# Patient Record
Sex: Male | Born: 1955 | Race: White | Hispanic: No | Marital: Married | Smoking: Never smoker
Health system: Southern US, Community
[De-identification: ages and names within clinical notes are randomized; demographics above are authoritative.]

## PROBLEM LIST (undated history)

## (undated) DIAGNOSIS — E785 Hyperlipidemia, unspecified: Secondary | ICD-10-CM

## (undated) DIAGNOSIS — I10 Essential (primary) hypertension: Secondary | ICD-10-CM

## (undated) DIAGNOSIS — E119 Type 2 diabetes mellitus without complications: Secondary | ICD-10-CM

## (undated) HISTORY — DX: Essential (primary) hypertension: I10

## (undated) HISTORY — DX: Hyperlipidemia, unspecified: E78.5

## (undated) HISTORY — DX: Type 2 diabetes mellitus without complications: E11.9

---

## 2006-04-06 ENCOUNTER — Ambulatory Visit: Payer: Self-pay | Admitting: Internal Medicine

## 2006-05-10 ENCOUNTER — Ambulatory Visit: Payer: Self-pay | Admitting: Internal Medicine

## 2013-05-28 ENCOUNTER — Encounter: Payer: Self-pay | Admitting: Internal Medicine

## 2016-05-25 ENCOUNTER — Encounter: Payer: Self-pay | Admitting: Internal Medicine

## 2021-09-23 ENCOUNTER — Ambulatory Visit: Payer: HMO | Admitting: Cardiology

## 2021-09-23 ENCOUNTER — Encounter: Payer: Self-pay | Admitting: Cardiology

## 2021-09-23 ENCOUNTER — Other Ambulatory Visit: Payer: Self-pay

## 2021-09-23 VITALS — BP 134/81 | HR 77 | Temp 98.0°F | Resp 16 | Ht 68.0 in | Wt 267.0 lb

## 2021-09-23 DIAGNOSIS — R0609 Other forms of dyspnea: Secondary | ICD-10-CM | POA: Insufficient documentation

## 2021-09-23 DIAGNOSIS — Z8249 Family history of ischemic heart disease and other diseases of the circulatory system: Secondary | ICD-10-CM

## 2021-09-23 DIAGNOSIS — I10 Essential (primary) hypertension: Secondary | ICD-10-CM | POA: Insufficient documentation

## 2021-09-23 DIAGNOSIS — R9439 Abnormal result of other cardiovascular function study: Secondary | ICD-10-CM

## 2021-09-23 DIAGNOSIS — E782 Mixed hyperlipidemia: Secondary | ICD-10-CM

## 2021-09-23 DIAGNOSIS — R072 Precordial pain: Secondary | ICD-10-CM

## 2021-09-23 DIAGNOSIS — E119 Type 2 diabetes mellitus without complications: Secondary | ICD-10-CM

## 2021-09-23 MED ORDER — PRAVASTATIN SODIUM 20 MG PO TABS: 20.0000 mg | ORAL_TABLET | Freq: Every evening | ORAL | 3 refills | Status: DC

## 2021-09-23 NOTE — Addendum Note (Signed)
Addended by: Elder Negus on: 09/23/2021 01:46 PM   Modules accepted: Orders

## 2021-09-23 NOTE — Progress Notes (Signed)
Patient self referred for dyspnea on exertion, chest pain, abnormal stress test  Subjective:   Gary Jimenez, male    DOB: 12-Jul-1956, 65 y.o.   MRN: [id number]   Chief Complaint  Patient presents with   Hypertension   New Patient (Initial Visit)   abnormal stress test     HPI  65 year old Caucasian male with hypertension, hyperlipidemia, type 2 diabetes mellitus, family history of premature CAD, self referred for evaluation of chest pain, dyspnea, abnormal stress test.  Patient is here today with his wife.  Patient worked at [employer] for several years, retired in 2019.  He is to be quite active doing yard work over several acres without much difficulty, until summer 2022.  Over the last 6 months, he has had progressive worsening of dyspnea on exertion.  Now, he struggles to do minimal yard work.  He has only occasional episodes of chest pain on exertion, but this is not a common feature.  He does have controlled hypertension and hyperlipidemia.  He has had stable weight around 260 pounds until about a month ago, and he is gained 6 pounds.  He has OSA, regularly uses CPAP.  He has controlled hypertension and type 2 diabetes mellitus.  Lipids are reasonably well controlled.  He has not tolerated statins-Crestor Lipitor, in the past due to myalgias.  He currently takes Zetia.  Patient was last seen by cardiologist Dr. Abran Richard at [facility] on 09/01/2021.  Patient underwent work-up for chest pain and dyspnea at their office.  Echocardiogram showed normal EF, no significant valvular abnormality.  Stress test showed moderate size inferior wall, partially reversible defect.  Given the patient's dyspnea had improved and he has not had any significant recurrent chest discomfort, option of medical management versus invasive work-up was offered to the patient.  However, patient wanted to seek second opinion with our practice since his wife is also established patient  of ours.   Past Medical History:  Diagnosis Date   Diabetes mellitus without complication ([place])    Hyperlipidemia    Hypertension      History reviewed. No pertinent surgical history.   Social History   Tobacco Use  Smoking Status Never  Smokeless Tobacco Never    Social History   Substance and Sexual Activity  Alcohol Use Not Currently     Family History  Problem Relation Age of Onset   Hyperlipidemia Mother    Hypertension Mother    Heart disease Mother    Heart disease Father      Current Outpatient Medications on File Prior to Visit  Medication Sig Dispense Refill   ezetimibe (ZETIA) 10 MG tablet Take 1 tablet by mouth daily.     olmesartan (BENICAR) 40 MG tablet Take 1 tablet by mouth daily.     amLODipine (NORVASC) 10 MG tablet Take 10 mg by mouth daily.     B Complex Vitamins (VITAMIN B COMPLEX) TABS Take 1 tablet by mouth daily.     glimepiride (AMARYL) 2 MG tablet Take 2 mg by mouth daily.     hydrochlorothiazide (HYDRODIURIL) 25 MG tablet Take 25 mg by mouth daily.     metFORMIN (GLUCOPHAGE-XR) 500 MG 24 hr tablet Take 1,000 mg by mouth 2 (two) times daily.     omeprazole (PRILOSEC) 40 MG capsule Take 40 mg by mouth daily.     No current facility-administered medications on file prior to visit.    Cardiovascular and other pertinent studies:  EKG 09/23/2021: Sinus rhythm 90 bpm Normal EKG  Echocardiogram 07/27/2021: Left ventricular systolic function is normal.  LV ejection fraction = 55-60%.  There is aortic valve sclerosis.  There is no aortic stenosis.  There is no comparison study available.    Recent labs: 06/01/2021: Glucose 140.  BUN/creatinine 36/1.56.  eGFR 49.  NA/K 138/4.7.  ALT 53.  Rest of the CMP normal. H/H 14/42.  MCV 89.  Platelets 245. HbA1c 7.0% TSH 0.84 normal  12/2020: Chol 167, TG 192, HDL 43,  LDL 104   Review of Systems  Cardiovascular:  Positive for chest pain and dyspnea on exertion. Negative for leg  swelling, palpitations and syncope.  Musculoskeletal:  Positive for joint pain.        Vitals:   09/23/21 0835  BP: 134/81  Pulse: 77  Resp: 16  Temp: 98 F (36.7 C)  SpO2: 96%     Body mass index is 40.6 kg/m. Filed Weights   09/23/21 0835  Weight: 267 lb (121.1 kg)     Objective:   Physical Exam Vitals and nursing note reviewed.  Constitutional:      General: He is not in acute distress.    Appearance: He is obese.  Neck:     Vascular: No JVD.  Cardiovascular:     Rate and Rhythm: Normal rate and regular rhythm.     Pulses: Normal pulses.     Heart sounds: Normal heart sounds. No murmur heard. Pulmonary:     Effort: Pulmonary effort is normal.     Breath sounds: Normal breath sounds. No wheezing or rales.  Musculoskeletal:     Right lower leg: No edema.     Left lower leg: No edema.        Assessment & Recommendations:    65 year old Caucasian male with hypertension, hyperlipidemia, type 2 diabetes mellitus, OSA on CPAP, family history of premature CAD, self referred for evaluation of chest pain, dyspnea, abnormal stress test.  Dyspnea on exertion, chest pain: Chest pain is occasional.  Dyspnea on exertion is persistent and worsening for past 6 months. Differential diagnoses include obesity, deconditioning, potential pulmonary pathology (prior exposure to particulate matter at work), or obstructive coronary artery disease with predominantly angina equivalent symptoms. Is currently at least 1 antianginal agent and amlodipine 10 mg daily, with no improvement in symptoms. Reported abnormal stress test performed at [facility], with moderate inferior ischemia. While pulmonary pathology is most likely etiology, it is reasonable to proceed with left and right heart catheterization to definitively exclude obstructive coronary artery disease, as well as pulmonary hypertension. Risks, benefits discussed with the patient in detail.  Patient would like  to proceed.  Mixed hyperlipidemia: Diabetic patient, currently on Zetia.  Intolerant to Crestor or Lipitor due to myalgias. Will try pravastatin 20 mg daily.  Hypertension: Well-controlled  Type 2 diabetes mellitus: Controlled, continue follow-up with PCP  Further recommendations after above testing  Thank you for referring the patient to [place]. Please feel free to contact with any questions.   Nigel Mormon, MD Pager: [phone] Office: [phone]

## 2021-09-24 LAB — BASIC METABOLIC PANEL
BUN/Creatinine Ratio: 18 (ref 10–24)
BUN: 25 mg/dL (ref 8–27)
CO2: 21 mmol/L (ref 20–29)
Calcium: 10.3 mg/dL — ABNORMAL HIGH (ref 8.6–10.2)
Chloride: 101 mmol/L (ref 96–106)
Creatinine, Ser: 1.36 mg/dL — ABNORMAL HIGH (ref 0.76–1.27)
Glucose: 237 mg/dL — ABNORMAL HIGH (ref 70–99)
Potassium: 4.7 mmol/L (ref 3.5–5.2)
Sodium: 144 mmol/L (ref 134–144)
eGFR: 58 mL/min/{1.73_m2} — ABNORMAL LOW (ref >59)

## 2021-09-24 LAB — CBC
Hematocrit: 45.4 % (ref 37.5–51.0)
Hemoglobin: 14.9 g/dL (ref 13.0–17.7)
MCH: 29.5 pg (ref 26.6–33.0)
MCHC: 32.8 g/dL (ref 31.5–35.7)
MCV: 90 fL (ref 79–97)
Platelets: 246 10*3/uL (ref 150–450)
RBC: 5.05 x10E6/uL (ref 4.14–5.80)
RDW: 12.4 % (ref 11.6–15.4)
WBC: 7.2 10*3/uL (ref 3.4–10.8)

## 2021-10-05 ENCOUNTER — Ambulatory Visit (HOSPITAL_COMMUNITY)
Admission: RE | Admit: 2021-10-05 | Discharge: 2021-10-05 | Disposition: A | Discharge status: Home / Self Care | Payer: HMO | Attending: Cardiology | Admitting: Cardiology

## 2021-10-05 ENCOUNTER — Ambulatory Visit (HOSPITAL_COMMUNITY)
Admission: RE | Disposition: A | Discharge status: Home / Self Care | Payer: Self-pay | Source: Home / Self Care | Attending: Cardiology

## 2021-10-05 DIAGNOSIS — Z7984 Long term (current) use of oral hypoglycemic drugs: Secondary | ICD-10-CM | POA: Insufficient documentation

## 2021-10-05 DIAGNOSIS — R9439 Abnormal result of other cardiovascular function study: Secondary | ICD-10-CM

## 2021-10-05 DIAGNOSIS — Z79899 Other long term (current) drug therapy: Secondary | ICD-10-CM | POA: Diagnosis not present

## 2021-10-05 DIAGNOSIS — I2510 Atherosclerotic heart disease of native coronary artery without angina pectoris: Secondary | ICD-10-CM | POA: Insufficient documentation

## 2021-10-05 DIAGNOSIS — I10 Essential (primary) hypertension: Secondary | ICD-10-CM | POA: Diagnosis not present

## 2021-10-05 DIAGNOSIS — G4733 Obstructive sleep apnea (adult) (pediatric): Secondary | ICD-10-CM | POA: Diagnosis not present

## 2021-10-05 DIAGNOSIS — R0609 Other forms of dyspnea: Secondary | ICD-10-CM | POA: Insufficient documentation

## 2021-10-05 DIAGNOSIS — R072 Precordial pain: Secondary | ICD-10-CM | POA: Diagnosis present

## 2021-10-05 DIAGNOSIS — E782 Mixed hyperlipidemia: Secondary | ICD-10-CM | POA: Insufficient documentation

## 2021-10-05 DIAGNOSIS — Z8249 Family history of ischemic heart disease and other diseases of the circulatory system: Secondary | ICD-10-CM | POA: Diagnosis not present

## 2021-10-05 DIAGNOSIS — E119 Type 2 diabetes mellitus without complications: Secondary | ICD-10-CM | POA: Insufficient documentation

## 2021-10-05 DIAGNOSIS — Z794 Long term (current) use of insulin: Secondary | ICD-10-CM

## 2021-10-05 HISTORY — PX: RIGHT/LEFT HEART CATH AND CORONARY ANGIOGRAPHY: CATH118266

## 2021-10-05 LAB — POCT I-STAT 7, (LYTES, BLD GAS, ICA,H+H)
Acid-base deficit: 2 mmol/L (ref 0.0–2.0)
Bicarbonate: 22.3 mmol/L (ref 20.0–28.0)
Calcium, Ion: 1.19 mmol/L (ref 1.15–1.40)
HCT: 39 % (ref 39.0–52.0)
Hemoglobin: 13.3 g/dL (ref 13.0–17.0)
O2 Saturation: 99 %
Potassium: 3.8 mmol/L (ref 3.5–5.1)
Sodium: 138 mmol/L (ref 135–145)
TCO2: 23 mmol/L (ref 22–32)
pCO2 arterial: 35.8 mmHg (ref 32.0–48.0)
pH, Arterial: 7.403 (ref 7.350–7.450)
pO2, Arterial: 134 mmHg — ABNORMAL HIGH (ref 83.0–108.0)

## 2021-10-05 LAB — POCT I-STAT EG7
Acid-base deficit: 1 mmol/L (ref 0.0–2.0)
Bicarbonate: 24.1 mmol/L (ref 20.0–28.0)
Calcium, Ion: 1.24 mmol/L (ref 1.15–1.40)
HCT: 40 % (ref 39.0–52.0)
Hemoglobin: 13.6 g/dL (ref 13.0–17.0)
O2 Saturation: 75 %
Potassium: 4 mmol/L (ref 3.5–5.1)
Sodium: 137 mmol/L (ref 135–145)
TCO2: 25 mmol/L (ref 22–32)
pCO2, Ven: 41.9 mmHg — ABNORMAL LOW (ref 44.0–60.0)
pH, Ven: 7.368 (ref 7.250–7.430)
pO2, Ven: 41 mmHg (ref 32.0–45.0)

## 2021-10-05 LAB — GLUCOSE, CAPILLARY
Glucose-Capillary: 245 mg/dL — ABNORMAL HIGH (ref 70–99)
Glucose-Capillary: 190 mg/dL — ABNORMAL HIGH (ref 70–99)

## 2021-10-05 LAB — POCT ACTIVATED CLOTTING TIME
Activated Clotting Time: 215 seconds
Activated Clotting Time: 269 seconds

## 2021-10-05 SURGERY — RIGHT/LEFT HEART CATH AND CORONARY ANGIOGRAPHY
Anesthesia: LOCAL

## 2021-10-05 MED ORDER — LABETALOL HCL 5 MG/ML IV SOLN: 10.0000 mg | INTRAVENOUS | Status: DC | PRN

## 2021-10-05 MED ORDER — ASPIRIN 81 MG PO CHEW
81.0000 mg | CHEWABLE_TABLET | ORAL | Status: DC
  Filled 2021-10-05: qty 1

## 2021-10-05 MED ORDER — LIDOCAINE HCL (PF) 1 % IJ SOLN
INTRAMUSCULAR | Status: AC
  Filled 2021-10-05: qty 30

## 2021-10-05 MED ORDER — HEPARIN (PORCINE) IN NACL 1000-0.9 UT/500ML-% IV SOLN
INTRAVENOUS | Status: AC
  Filled 2021-10-05: qty 500

## 2021-10-05 MED ORDER — NITROGLYCERIN 1 MG/10 ML FOR IR/CATH LAB
INTRA_ARTERIAL | Status: AC
  Filled 2021-10-05: qty 10

## 2021-10-05 MED ORDER — VERAPAMIL HCL 2.5 MG/ML IV SOLN
INTRAVENOUS | Status: DC | PRN
  Administered 2021-10-05: 10 mL via INTRA_ARTERIAL

## 2021-10-05 MED ORDER — LIDOCAINE HCL (PF) 1 % IJ SOLN
INTRAMUSCULAR | Status: DC | PRN
  Administered 2021-10-05 (×2): 2 mL

## 2021-10-05 MED ORDER — HEPARIN (PORCINE) IN NACL 1000-0.9 UT/500ML-% IV SOLN
INTRAVENOUS | Status: DC | PRN
  Administered 2021-10-05 (×3): 500 mL

## 2021-10-05 MED ORDER — VERAPAMIL HCL 2.5 MG/ML IV SOLN
INTRAVENOUS | Status: AC
  Filled 2021-10-05: qty 2

## 2021-10-05 MED ORDER — IOHEXOL 350 MG/ML SOLN
INTRAVENOUS | Status: DC | PRN
  Administered 2021-10-05: 120 mL

## 2021-10-05 MED ORDER — FENTANYL CITRATE (PF) 100 MCG/2ML IJ SOLN
INTRAMUSCULAR | Status: AC
  Filled 2021-10-05: qty 2

## 2021-10-05 MED ORDER — SODIUM CHLORIDE 0.9 % WEIGHT BASED INFUSION
3.0000 mL/kg/h | INTRAVENOUS | Status: AC
  Administered 2021-10-05: 3 mL/kg/h via INTRAVENOUS

## 2021-10-05 MED ORDER — HYDRALAZINE HCL 20 MG/ML IJ SOLN: 10.0000 mg | INTRAMUSCULAR | Status: DC | PRN

## 2021-10-05 MED ORDER — SODIUM CHLORIDE 0.9% FLUSH: 3.0000 mL | Freq: Two times a day (BID) | INTRAVENOUS | Status: DC

## 2021-10-05 MED ORDER — FENTANYL CITRATE (PF) 100 MCG/2ML IJ SOLN
INTRAMUSCULAR | Status: DC | PRN
  Administered 2021-10-05: 25 ug via INTRAVENOUS
  Administered 2021-10-05: 50 ug via INTRAVENOUS

## 2021-10-05 MED ORDER — SODIUM CHLORIDE 0.9 % IV SOLN: 250.0000 mL | INTRAVENOUS | Status: DC | PRN

## 2021-10-05 MED ORDER — HEPARIN (PORCINE) IN NACL 1000-0.9 UT/500ML-% IV SOLN
INTRAVENOUS | Status: AC
  Filled 2021-10-05: qty 1000

## 2021-10-05 MED ORDER — MIDAZOLAM HCL 2 MG/2ML IJ SOLN
INTRAMUSCULAR | Status: AC
  Filled 2021-10-05: qty 2

## 2021-10-05 MED ORDER — METOPROLOL SUCCINATE ER 25 MG PO TB24: 25.0000 mg | ORAL_TABLET | Freq: Every day | ORAL | 1 refills | Status: DC

## 2021-10-05 MED ORDER — HEPARIN SODIUM (PORCINE) 1000 UNIT/ML IJ SOLN
INTRAMUSCULAR | Status: AC
  Filled 2021-10-05: qty 10

## 2021-10-05 MED ORDER — SODIUM CHLORIDE 0.9 % WEIGHT BASED INFUSION: 1.0000 mL/kg/h | INTRAVENOUS | Status: DC

## 2021-10-05 MED ORDER — SODIUM CHLORIDE 0.9 % IV SOLN: INTRAVENOUS | Status: DC

## 2021-10-05 MED ORDER — ONDANSETRON HCL 4 MG/2ML IJ SOLN: 4.0000 mg | Freq: Four times a day (QID) | INTRAMUSCULAR | Status: DC | PRN

## 2021-10-05 MED ORDER — ACETAMINOPHEN 325 MG PO TABS: 650.0000 mg | ORAL_TABLET | ORAL | Status: DC | PRN

## 2021-10-05 MED ORDER — ASPIRIN 81 MG PO CHEW
81.0000 mg | CHEWABLE_TABLET | ORAL | Status: AC
  Administered 2021-10-05: 81 mg via ORAL

## 2021-10-05 MED ORDER — ASPIRIN EC 81 MG PO TBEC: 81.0000 mg | DELAYED_RELEASE_TABLET | Freq: Every day | ORAL | 3 refills | Status: DC

## 2021-10-05 MED ORDER — SODIUM CHLORIDE 0.9% FLUSH: 3.0000 mL | INTRAVENOUS | Status: DC | PRN

## 2021-10-05 MED ORDER — MIDAZOLAM HCL 2 MG/2ML IJ SOLN
INTRAMUSCULAR | Status: DC | PRN
  Administered 2021-10-05 (×2): 1 mg via INTRAVENOUS

## 2021-10-05 MED ORDER — HEPARIN SODIUM (PORCINE) 1000 UNIT/ML IJ SOLN
INTRAMUSCULAR | Status: DC | PRN
  Administered 2021-10-05: 3000 [IU] via INTRAVENOUS
  Administered 2021-10-05 (×2): 6000 [IU] via INTRAVENOUS

## 2021-10-05 SURGICAL SUPPLY — 21 items
CATH 5FR JL3.5 JR4 ANG PIG MP (CATHETERS) ×1 IMPLANT
CATH BALLN WEDGE 5F 110CM (CATHETERS) ×1 IMPLANT
CATH LAUNCHER 6FR EBU 3 (CATHETERS) ×1 IMPLANT
CATH SUPERCROSS ANGLED 90 DEG (MICROCATHETER) ×1 IMPLANT
CATH VISTA GUIDE 6FR XBLAD3.0 (CATHETERS) ×1 IMPLANT
DEVICE RAD COMP TR BAND LRG (VASCULAR PRODUCTS) ×1 IMPLANT
GLIDESHEATH SLEND A-KIT 6F 22G (SHEATH) ×1 IMPLANT
GUIDEWIRE .025 260CM (WIRE) ×1 IMPLANT
GUIDEWIRE INQWIRE 1.5J.035X260 (WIRE) IMPLANT
GUIDEWIRE PRESSURE X 175 (WIRE) ×1 IMPLANT
INQWIRE 1.5J .035X260CM (WIRE) ×2
KIT ESSENTIALS PG (KITS) ×1 IMPLANT
KIT HEART LEFT (KITS) ×2 IMPLANT
KIT HEMO VALVE WATCHDOG (MISCELLANEOUS) ×1 IMPLANT
PACK CARDIAC CATHETERIZATION (CUSTOM PROCEDURE TRAY) ×2 IMPLANT
SHEATH GLIDE SLENDER 4/5FR (SHEATH) ×1 IMPLANT
TRANSDUCER W/STOPCOCK (MISCELLANEOUS) ×2 IMPLANT
TUBING CIL FLEX 10 FLL-RA (TUBING) ×2 IMPLANT
WIRE ASAHI PROWATER 180CM (WIRE) ×1 IMPLANT
WIRE ASAHI PROWATER 300CM (WIRE) ×1 IMPLANT
WIRE HI TORQ WHISPER MS 190CM (WIRE) ×1 IMPLANT

## 2021-10-05 NOTE — H&P (Signed)
OV 12/22 copied for documentation     Patient self referred for dyspnea on exertion, chest pain, abnormal stress test  Subjective:   Gary Jimenez, male    DOB: 1956-01-08, 66 y.o.   MRN: [id number]   Chief Complaint  Patient presents with   Hypertension   New Patient (Initial Visit)   abnormal stress test     HPI  66 year old Caucasian male with hypertension, hyperlipidemia, type 2 diabetes mellitus, family history of premature CAD, self referred for evaluation of chest pain, dyspnea, abnormal stress test.  Patient is here today with his wife.  Patient worked at [employer] for several years, retired in 2019.  He is to be quite active doing yard work over several acres without much difficulty, until summer 2022.  Over the last 6 months, he has had progressive worsening of dyspnea on exertion.  Now, he struggles to do minimal yard work.  He has only occasional episodes of chest pain on exertion, but this is not a common feature.  He does have controlled hypertension and hyperlipidemia.  He has had stable weight around 260 pounds until about a month ago, and he is gained 6 pounds.  He has OSA, regularly uses CPAP.  He has controlled hypertension and type 2 diabetes mellitus.  Lipids are reasonably well controlled.  He has not tolerated statins-Crestor Lipitor, in the past due to myalgias.  He currently takes Zetia.  Patient was last seen by cardiologist Dr. Abran Richard at [facility] on 09/01/2021.  Patient underwent work-up for chest pain and dyspnea at their office.  Echocardiogram showed normal EF, no significant valvular abnormality.  Stress test showed moderate size inferior wall, partially reversible defect.  Given the patient's dyspnea had improved and he has not had any significant recurrent chest discomfort, option of medical management versus invasive work-up was offered to the patient.  However, patient wanted to seek second opinion with our practice since  his wife is also established patient of ours.   Past Medical History:  Diagnosis Date   Diabetes mellitus without complication ([place])    Hyperlipidemia    Hypertension      History reviewed. No pertinent surgical history.   Social History   Tobacco Use  Smoking Status Never  Smokeless Tobacco Never    Social History   Substance and Sexual Activity  Alcohol Use Not Currently     Family History  Problem Relation Age of Onset   Hyperlipidemia Mother    Hypertension Mother    Heart disease Mother    Heart disease Father      Current Outpatient Medications on File Prior to Visit  Medication Sig Dispense Refill   ezetimibe (ZETIA) 10 MG tablet Take 1 tablet by mouth daily.     olmesartan (BENICAR) 40 MG tablet Take 1 tablet by mouth daily.     amLODipine (NORVASC) 10 MG tablet Take 10 mg by mouth daily.     B Complex Vitamins (VITAMIN B COMPLEX) TABS Take 1 tablet by mouth daily.     glimepiride (AMARYL) 2 MG tablet Take 2 mg by mouth daily.     hydrochlorothiazide (HYDRODIURIL) 25 MG tablet Take 25 mg by mouth daily.     metFORMIN (GLUCOPHAGE-XR) 500 MG 24 hr tablet Take 1,000 mg by mouth 2 (two) times daily.     omeprazole (PRILOSEC) 40 MG capsule Take 40 mg by mouth daily.     No current facility-administered medications on file prior to visit.  Cardiovascular and other pertinent studies:  EKG 09/23/2021: Sinus rhythm 90 bpm Normal EKG  Echocardiogram 07/27/2021: Left ventricular systolic function is normal.  LV ejection fraction = 55-60%.  There is aortic valve sclerosis.  There is no aortic stenosis.  There is no comparison study available.    Recent labs: 06/01/2021: Glucose 140.  BUN/creatinine 36/1.56.  eGFR 49.  NA/K 138/4.7.  ALT 53.  Rest of the CMP normal. H/H 14/42.  MCV 89.  Platelets 245. HbA1c 7.0% TSH 0.84 normal  12/2020: Chol 167, TG 192, HDL 43,  LDL 104   Review of Systems  Cardiovascular:  Positive for chest pain and dyspnea  on exertion. Negative for leg swelling, palpitations and syncope.  Musculoskeletal:  Positive for joint pain.        Vitals:   09/23/21 0835  BP: 134/81  Pulse: 77  Resp: 16  Temp: 98 F (36.7 C)  SpO2: 96%     Body mass index is 40.6 kg/m. Filed Weights   09/23/21 0835  Weight: 267 lb (121.1 kg)     Objective:   Physical Exam Vitals and nursing note reviewed.  Constitutional:      General: He is not in acute distress.    Appearance: He is obese.  Neck:     Vascular: No JVD.  Cardiovascular:     Rate and Rhythm: Normal rate and regular rhythm.     Pulses: Normal pulses.     Heart sounds: Normal heart sounds. No murmur heard. Pulmonary:     Effort: Pulmonary effort is normal.     Breath sounds: Normal breath sounds. No wheezing or rales.  Musculoskeletal:     Right lower leg: No edema.     Left lower leg: No edema.        Assessment & Recommendations:    66 year old Caucasian male with hypertension, hyperlipidemia, type 2 diabetes mellitus, OSA on CPAP, family history of premature CAD, self referred for evaluation of chest pain, dyspnea, abnormal stress test.  Dyspnea on exertion, chest pain: Chest pain is occasional.  Dyspnea on exertion is persistent and worsening for past 6 months. Differential diagnoses include obesity, deconditioning, potential pulmonary pathology (prior exposure to particulate matter at work), or obstructive coronary artery disease with predominantly angina equivalent symptoms. Is currently at least 1 antianginal agent and amlodipine 10 mg daily, with no improvement in symptoms. Reported abnormal stress test performed at [facility] in [facility], with moderate inferior ischemia. While pulmonary pathology is most likely etiology, it is reasonable to proceed with left and right heart catheterization to definitively exclude obstructive coronary artery disease, as well as pulmonary hypertension. Risks, benefits discussed with the patient in  detail.  Patient would like to proceed.  Mixed hyperlipidemia: Diabetic patient, currently on Zetia.  Intolerant to Crestor or Lipitor due to myalgias. Will try pravastatin 20 mg daily.  Hypertension: Well-controlled  Type 2 diabetes mellitus: Controlled, continue follow-up with PCP  Further recommendations after above testing  Thank you for referring the patient to [place]. Please feel free to contact with any questions.   Nigel Mormon, MD Pager: [phone] Office: [phone]

## 2021-10-05 NOTE — Interval H&P Note (Signed)
History and Physical Interval Note:  10/05/2021 10:50 AM  Gary Jimenez  has presented today for surgery, with the diagnosis of POSITIVE STRESS TEST.  The various methods of treatment have been discussed with the patient and family. After consideration of risks, benefits and other options for treatment, the patient has consented to  Procedure(s): RIGHT/LEFT HEART CATH AND CORONARY ANGIOGRAPHY (N/A) as a surgical intervention.  The patient's history has been reviewed, patient examined, no change in status, stable for surgery.  I have reviewed the patient's chart and labs.  Questions were answered to the patient's satisfaction.    2016/2017 Appropriate Use Criteria for Coronary Revascularization Symptom Status: Ischemic Symptoms  Non-invasive Testing: Intermediate Risk  If no or indeterminate stress test, FFR/iFR results in all diseased vessels: N/A  Diabetes Mellitus: Yes  S/P CABG: No  Antianginal therapy (number of long-acting drugs): 1  Patient undergoing renal transplant: No  Patient undergoing percutaneous valve procedure: No  1 Vessel Disease PCI CABG  No proximal LAD involvement, No proximal left dominant LCX involvement M (6); Indication 2 M (4); Indication 2  Proximal left dominant LCX involvement A (7); Indication 5 A (7); Indication 5  Proximal LAD involvement A (7); Indication 5 A (7); Indication 5  2 Vessel Disease  No proximal LAD involvement A (7); Indication 8 M (6); Indication 8  Proximal LAD involvement A (7); Indication 14 A (8); Indication 14  3 Vessel Disease  Low disease complexity (e.g., focal stenoses, SYNTAX <=22) A (7); Indication 19 A (8); Indication 19  Intermediate or high disease complexity (e.g., SYNTAX >=23) M (5); Indication 23 A (8); Indication 23  Left Main Disease  Isolated LMCA disease: ostial or midshaft A (7); Indication 24 A (9); Indication 24  Isolated LMCA disease: bifurcation involvement M (5); Indication 25 A (9); Indication 25  LMCA ostial or  midshaft, concurrent low disease burden multivessel disease (e.g., 1-2 additional focal stenoses, SYNTAX <=22) A (7); Indication 26 A (9); Indication 26  LMCA ostial or midshaft, concurrent intermediate or high disease burden multivessel disease (e.g., 1-2 additional bifurcation stenoses, long stenoses, SYNTAX >=23) M (4); Indication 27 A (9); Indication 27  LMCA bifurcation involvement, concurrent low disease burden multivessel disease (e.g., 1-2 additional focal stenoses, SYNTAX <=22) M (5); Indication 28 A (9); Indication 28  LMCA bifurcation involvement, concurrent intermediate or high disease burden multivessel disease (e.g., 1-2 additional bifurcation stenoses, long stenoses, SYNTAX >=23) R (3); Indication 29 A (9); Indication 29     Conlan Miceli J Margaret Staggs

## 2021-10-06 ENCOUNTER — Encounter (HOSPITAL_COMMUNITY): Payer: Self-pay | Admitting: Cardiology

## 2021-10-06 MED FILL — Nitroglycerin IV Soln 100 MCG/ML in D5W: INTRA_ARTERIAL | Qty: 10 | Status: AC

## 2021-10-19 DIAGNOSIS — I2510 Atherosclerotic heart disease of native coronary artery without angina pectoris: Secondary | ICD-10-CM | POA: Insufficient documentation

## 2021-10-19 NOTE — Progress Notes (Signed)
Patient self referred for dyspnea on exertion, chest pain, abnormal stress test  Subjective:   Gary Jimenez, male    DOB: 01-08-1956, 66 y.o.   MRN: [id number]  Chief Complaint  Patient presents with   Shortness of Breath   Hospitalization Follow-up    HPI  66 year old Caucasian male with hypertension, hyperlipidemia, type 2 diabetes mellitus, CAD   See details below re: RHC/LHC 10/2021. No heart failure/pulmonary hypertension. Severe mid LAD stenosis, unable to wire LAD due to severe toruosity, thus aborted, started on medical therapy.   Patient's exertional dyspnea is unchanged.  Starting metoprolol, he is feeling more tired and fatigued.  He has an upcoming appointment to see pulmonologist next week.  Initial consultation visit 09/2021: Patient is here today with his wife.  Patient worked at [employer] for several years, retired in 2019.  He is to be quite active doing yard work over several acres without much difficulty, until summer 2022.  Over the last 6 months, he has had progressive worsening of dyspnea on exertion.  Now, he struggles to do minimal yard work.  He has only occasional episodes of chest pain on exertion, but this is not a common feature.  He does have controlled hypertension and hyperlipidemia.  He has had stable weight around 260 pounds until about a month ago, and he is gained 6 pounds.  He has OSA, regularly uses CPAP.  He has controlled hypertension and type 2 diabetes mellitus.  Lipids are reasonably well controlled.  He has not tolerated statins-Crestor Lipitor, in the past due to myalgias.  He currently takes Zetia.  Patient was last seen by cardiologist Dr. Abran Richard at [facility] on 09/01/2021.  Patient underwent work-up for chest pain and dyspnea at their office.  Echocardiogram showed normal EF, no significant valvular abnormality.  Stress test showed moderate size inferior wall, partially reversible defect.  Given the patient's  dyspnea had improved and he has not had any significant recurrent chest discomfort, option of medical management versus invasive work-up was offered to the patient.  However, patient wanted to seek second opinion with our practice since his wife is also established patient of ours.    Current Outpatient Medications on File Prior to Visit  Medication Sig Dispense Refill   aspirin EC 81 MG tablet Take 1 tablet (81 mg total) by mouth daily. 90 tablet 3   ezetimibe (ZETIA) 10 MG tablet Take 10 mg by mouth daily.     glimepiride (AMARYL) 2 MG tablet Take 2 mg by mouth daily.     metFORMIN (GLUCOPHAGE-XR) 500 MG 24 hr tablet Take 1,000 mg by mouth 2 (two) times daily.     Olmesartan-amLODIPine-HCTZ 40-10-25 MG TABS Take 1 tablet by mouth daily.     omeprazole (PRILOSEC) 40 MG capsule Take 40 mg by mouth daily.     pravastatin (PRAVACHOL) 20 MG tablet Take 1 tablet (20 mg total) by mouth every evening. 30 tablet 3   vitamin B-12 (CYANOCOBALAMIN) 1000 MCG tablet Take 1,000 mcg by mouth daily.     Vitamin D3 (VITAMIN D) 25 MCG tablet Take 1,000 Units by mouth daily.     No current facility-administered medications on file prior to visit.    Cardiovascular and other pertinent studies:  Coronary angiography 10/05/2021: LM: Normal LAD: Tortuous          Mid LAD 80% stenosis Lcx: No significant disease Ramus: No significant disease RCA: Large, tortuous. No significant disease   Normal  filling pressures   Unable to wire LAD due to tortuosity and angulated takeoff. Aborted due to amount of radiation and contrast used. Recommend medical therapy for now. Added Aspirin 81 mg and metoprolol succinate 25 mg daily. Will re-attempt down the road, if symptoms do not improve  EKG 09/23/2021: Sinus rhythm 90 bpm Normal EKG  Echocardiogram 07/27/2021: Left ventricular systolic function is normal.  LV ejection fraction = 55-60%.  There is aortic valve sclerosis.  There is no aortic stenosis.  There  is no comparison study available.    Recent labs: 09/23/2021: Glucose 237, BUN/Cr 25/1.36. EGFR 58. Na/K 144/4.7.  H/H 14/45. MCV 90. Platelets 246  06/01/2021: Glucose 140.  BUN/creatinine 36/1.56.  eGFR 49.  NA/K 138/4.7.  ALT 53.  Rest of the CMP normal. H/H 14/42.  MCV 89.  Platelets 245. HbA1c 7.0% TSH 0.84 normal  12/2020: Chol 167, TG 192, HDL 43,  LDL 104   Review of Systems  Constitutional: Positive for malaise/fatigue.  Cardiovascular:  Positive for dyspnea on exertion. Negative for chest pain, leg swelling, palpitations and syncope.  Musculoskeletal:  Positive for joint pain.        Vitals:   10/20/21 0929  BP: 127/77  Pulse: 81  Temp: 97.8 F (36.6 C)     Body mass index is 39.97 kg/m. Filed Weights   10/20/21 0929  Weight: 259 lb (117.5 kg)     Objective:   Physical Exam Vitals and nursing note reviewed.  Constitutional:      General: He is not in acute distress.    Appearance: He is obese.  Neck:     Vascular: No JVD.  Cardiovascular:     Rate and Rhythm: Normal rate and regular rhythm.     Pulses: Normal pulses.     Heart sounds: Normal heart sounds. No murmur heard. Pulmonary:     Effort: Pulmonary effort is normal.     Breath sounds: Normal breath sounds. No wheezing or rales.  Musculoskeletal:     Right lower leg: No edema.     Left lower leg: No edema.        Assessment & Recommendations:    66 year old Caucasian male with hypertension, hyperlipidemia, type 2 diabetes mellitus, CAD   CAD with angina equivalent/exertional dyspnea: Severe mid LAD stenosis (10/2021), unable to wire LAD due to tortuosity and angulated takeoff. Continue medical management with aspirin 81 mg, amlodipine as part of his combination pill. Did not tolerate metoprolol due to fatigue.  Changed to Imdur 30 mg daily. Currently on pravastatin 20 mg daily, Zetia 10 mg daily, intolerant to Crestor and Lipitor due to severe myalgias. Check lipid panel now.  If  LDL remains >70, will add Repatha. His exertional dyspnea could be angina equivalent.  If no other pulmonary pathology identified, and symptoms do not improve with medical therapy, could consider reattempt of PCI to LAD.  However, I have explained to the patient and his wife that his LAD is very tortuous and successful stenting remains a difficult proposition due to tortuosity.  Nonetheless, we could reattempted in future, if his symptoms do not improve.  Mixed hyperlipidemia: As above.  Hypertension: Well-controlled  Type 2 diabetes mellitus: Controlled, continue follow-up with PCP  F/u in March 2023   Nigel Mormon, MD Pager: [phone] Office: [phone]

## 2021-10-20 ENCOUNTER — Encounter: Payer: Self-pay | Admitting: Cardiology

## 2021-10-20 ENCOUNTER — Ambulatory Visit: Payer: HMO | Admitting: Cardiology

## 2021-10-20 ENCOUNTER — Other Ambulatory Visit: Payer: Self-pay

## 2021-10-20 VITALS — BP 127/77 | HR 81 | Temp 97.8°F | Ht 67.5 in | Wt 259.0 lb

## 2021-10-20 DIAGNOSIS — I10 Essential (primary) hypertension: Secondary | ICD-10-CM

## 2021-10-20 DIAGNOSIS — E119 Type 2 diabetes mellitus without complications: Secondary | ICD-10-CM

## 2021-10-20 DIAGNOSIS — I25118 Atherosclerotic heart disease of native coronary artery with other forms of angina pectoris: Secondary | ICD-10-CM

## 2021-10-20 DIAGNOSIS — E782 Mixed hyperlipidemia: Secondary | ICD-10-CM

## 2021-10-20 DIAGNOSIS — R9439 Abnormal result of other cardiovascular function study: Secondary | ICD-10-CM

## 2021-10-20 DIAGNOSIS — Z794 Long term (current) use of insulin: Secondary | ICD-10-CM

## 2021-10-20 MED ORDER — ISOSORBIDE MONONITRATE ER 30 MG PO TB24: 30.0000 mg | ORAL_TABLET | Freq: Every day | ORAL | 3 refills | Status: DC

## 2021-10-21 LAB — LIPID PANEL
Chol/HDL Ratio: 3.4 ratio (ref 0.0–5.0)
Cholesterol, Total: 154 mg/dL (ref 100–199)
HDL: 45 mg/dL
LDL Chol Calc (NIH): 73 mg/dL (ref 0–99)
Triglycerides: 221 mg/dL — ABNORMAL HIGH (ref 0–149)
VLDL Cholesterol Cal: 36 mg/dL (ref 5–40)

## 2021-10-21 NOTE — Progress Notes (Signed)
LDL is better with pravastatin 20. Triglyceride is mildly elevated still. Would you be okay going up to pravastatin 40? We may be able to hold off Repatha for now.  If patient agrees, please send 40 mg 90 [id number] refills  Thanks MJP

## 2021-10-22 ENCOUNTER — Other Ambulatory Visit: Payer: Self-pay

## 2021-10-22 DIAGNOSIS — E119 Type 2 diabetes mellitus without complications: Secondary | ICD-10-CM

## 2021-10-22 MED ORDER — PRAVASTATIN SODIUM 40 MG PO TABS: 40.0000 mg | ORAL_TABLET | Freq: Every evening | ORAL | 2 refills | Status: DC

## 2021-10-22 NOTE — Progress Notes (Signed)
Called pt to inform him about his lab results. Pt agree and send 40mg  Pravastatin to the pharmacy. Pt understood.

## 2021-10-26 ENCOUNTER — Ambulatory Visit (INDEPENDENT_AMBULATORY_CARE_PROVIDER_SITE_OTHER): Payer: PPO

## 2021-10-26 ENCOUNTER — Ambulatory Visit: Payer: PPO | Admitting: Pulmonary Disease

## 2021-10-26 ENCOUNTER — Encounter: Payer: Self-pay | Admitting: Pulmonary Disease

## 2021-10-26 ENCOUNTER — Other Ambulatory Visit: Payer: Self-pay

## 2021-10-26 VITALS — BP 132/66 | HR 75 | Temp 98.3°F | Ht 67.0 in | Wt 258.4 lb

## 2021-10-26 DIAGNOSIS — R0600 Dyspnea, unspecified: Secondary | ICD-10-CM | POA: Diagnosis not present

## 2021-10-26 NOTE — Patient Instructions (Signed)
We get her lungs checked out with a chest x-ray and lung function test Based on these we may order additional CT of the chest Follow-up in 1 to 2 months after PFTs

## 2021-10-26 NOTE — Progress Notes (Signed)
Gary Jimenez    [id number]    Jan 11, 1956  Primary Care Physician:Robbins, Audree Camel, MD  Referring Physician: Nigel Mormon, MD [address] [place],  [place] [address]  Chief complaint: Consult for dyspnea  HPI: 67 year old with history of hyperlipidemia, type 2 diabetes, coronary artery disease.  Referred here for evaluation of chronic dyspnea on exertion.  He has exertional dyspnea, no symptoms at rest.  Occasional cough with mucus production.  No wheezing  He is followed by Dr. Virgina Jock, cardiology with a recent right and left heart cath in January 2023 with severe mid LAD stenosis.  Unable to wire LAD due to severe toruosity, thus aborted, started on medical therapy  History notable for OSA for which he uses CPAP He is a never smoker with no significant exposures  Outpatient Encounter Medications as of 10/26/2021  Medication Sig   aspirin EC 81 MG tablet Take 1 tablet (81 mg total) by mouth daily.   ezetimibe (ZETIA) 10 MG tablet Take 10 mg by mouth daily.   glimepiride (AMARYL) 2 MG tablet Take 2 mg by mouth daily.   isosorbide mononitrate (IMDUR) 30 MG 24 hr tablet Take 1 tablet (30 mg total) by mouth daily.   metFORMIN (GLUCOPHAGE-XR) 500 MG 24 hr tablet Take 1,000 mg by mouth 2 (two) times daily.   Olmesartan-amLODIPine-HCTZ 40-10-25 MG TABS Take 1 tablet by mouth daily.   omeprazole (PRILOSEC) 40 MG capsule Take 40 mg by mouth daily.   pravastatin (PRAVACHOL) 40 MG tablet Take 1 tablet (40 mg total) by mouth every evening.   vitamin B-12 (CYANOCOBALAMIN) 1000 MCG tablet Take 1,000 mcg by mouth daily.   Vitamin D3 (VITAMIN D) 25 MCG tablet Take 1,000 Units by mouth daily.   No facility-administered encounter medications on file as of 10/26/2021.    Allergies as of 10/26/2021 - Review Complete 10/26/2021  Allergen Reaction Noted   Cephalexin Other (See Comments) 01/05/2016    Past Medical History:  Diagnosis Date   Diabetes  mellitus without complication ([place])    Hyperlipidemia    Hypertension     Past Surgical History:  Procedure Laterality Date   RIGHT/LEFT HEART CATH AND CORONARY ANGIOGRAPHY N/A 10/05/2021   Procedure: RIGHT/LEFT HEART CATH AND CORONARY ANGIOGRAPHY;  Surgeon: Nigel Mormon, MD;  Location: [facility];  Service: Cardiovascular;  Laterality: N/A;    Family History  Problem Relation Age of Onset   Hyperlipidemia Mother    Hypertension Mother    Heart disease Mother    Heart disease Father     Social History   Socioeconomic History   Marital status: Married    Spouse name: Not on file   Number of children: 2   Years of education: Not on file   Highest education level: Not on file  Occupational History   Not on file  Tobacco Use   Smoking status: Never   Smokeless tobacco: Never  Vaping Use   Vaping Use: Never used  Substance and Sexual Activity   Alcohol use: Not Currently   Drug use: Not Currently   Sexual activity: Not on file  Other Topics Concern   Not on file  Social History Narrative   Not on file   Social Determinants of Health   Financial Resource Strain: Not on file  Food Insecurity: Not on file  Transportation Needs: Not on file  Physical Activity: Not on file  Stress: Not on file  Social Connections: Not on file  Intimate Partner Violence: Not on file    Review of systems: Review of Systems  Constitutional: Negative for fever and chills.  HENT: Negative.   Eyes: Negative for blurred vision.  Respiratory: as per HPI  Cardiovascular: Negative for chest pain and palpitations.  Gastrointestinal: Negative for vomiting, diarrhea, blood per rectum. Genitourinary: Negative for dysuria, urgency, frequency and hematuria.  Musculoskeletal: Negative for myalgias, back pain and joint pain.  Skin: Negative for itching and rash.  Neurological: Negative for dizziness, tremors, focal weakness, seizures and loss of consciousness.  Endo/Heme/Allergies:  Negative for environmental allergies.  Psychiatric/Behavioral: Negative for depression, suicidal ideas and hallucinations.  All other systems reviewed and are negative.  Physical Exam: Blood pressure 132/66, pulse 75, temperature 98.3 F (36.8 C), temperature source Oral, height 5\' 7"  (1.702 m), weight 258 lb 6.4 oz (117.2 kg), SpO2 98 %. Gen:      No acute distress HEENT:  EOMI, sclera anicteric Neck:     No masses; no thyromegaly Lungs:    Clear to auscultation bilaterally; normal respiratory effort CV:         Regular rate and rhythm; no murmurs Abd:      + bowel sounds; soft, non-tender; no palpable masses, no distension Ext:    No edema; adequate peripheral perfusion Skin:      Warm and dry; no rash Neuro: alert and oriented x 3 Psych: normal mood and affect  Data Reviewed: Imaging:   PFTs:  Labs:  Assessment:  Evaluation for exertional dyspnea May be related to cardiac issues He does not have significant smoking history so suspicion for COPD slow We will get a chest x-ray today and PFTs for better evaluation of the lungs Return to clinic in 1 month for reevaluation  Plan/Recommendations: Chest x-ray, PFTs  Marshell Garfinkel MD [facility] 10/26/2021, 3:43 PM  CC: Nigel Mormon, MD

## 2021-10-28 ENCOUNTER — Encounter: Payer: Self-pay | Admitting: Pulmonary Disease

## 2021-11-04 ENCOUNTER — Telehealth: Payer: Self-pay

## 2021-11-04 NOTE — Telephone Encounter (Signed)
Very unlikely that pravastatin alone would cause a sharp jump in blood sugar levels. To be sure, we can hold pravastatin for a week and see if there is any sharp drop in blood sugar with that. Did PCP make any changes to diabetes management?  Thanks MJP

## 2021-11-04 NOTE — Telephone Encounter (Signed)
Called pt wife she understood. She mention they did but is not sure that the new of the medication they gave him is called.

## 2021-12-02 ENCOUNTER — Ambulatory Visit: Payer: HMO | Admitting: Cardiology

## 2021-12-13 ENCOUNTER — Encounter: Payer: Self-pay | Admitting: Pulmonary Disease

## 2021-12-13 ENCOUNTER — Ambulatory Visit (INDEPENDENT_AMBULATORY_CARE_PROVIDER_SITE_OTHER): Payer: PPO | Admitting: Pulmonary Disease

## 2021-12-13 ENCOUNTER — Other Ambulatory Visit: Payer: Self-pay

## 2021-12-13 VITALS — BP 120/60 | HR 89 | Temp 97.9°F | Ht 68.0 in | Wt 259.0 lb

## 2021-12-13 DIAGNOSIS — R0600 Dyspnea, unspecified: Secondary | ICD-10-CM | POA: Diagnosis not present

## 2021-12-13 LAB — PULMONARY FUNCTION TEST
DL/VA % pred: 95 %
DL/VA: 3.99 ml/min/mmHg/L
DLCO cor % pred: 82 %
DLCO cor: 20.65 ml/min/mmHg
DLCO unc % pred: 82 %
DLCO unc: 20.65 ml/min/mmHg
FEF 25-75 Post: 2.08 L/sec
FEF 25-75 Pre: 2.1 L/sec
FEF2575-%Change-Post: 0 %
FEF2575-%Pred-Post: 81 %
FEF2575-%Pred-Pre: 82 %
FEV1-%Change-Post: -5 %
FEV1-%Pred-Post: 73 %
FEV1-%Pred-Pre: 78 %
FEV1-Post: 2.35 L
FEV1-Pre: 2.49 L
FEV1FVC-%Change-Post: -8 %
FEV1FVC-%Pred-Pre: 100 %
FEV6-%Change-Post: 3 %
FEV6-%Pred-Post: 84 %
FEV6-%Pred-Pre: 81 %
FEV6-Post: 3.42 L
FEV6-Pre: 3.3 L
FEV6FVC-%Pred-Post: 105 %
FEV6FVC-%Pred-Pre: 105 %
FVC-%Change-Post: 2 %
FVC-%Pred-Post: 79 %
FVC-%Pred-Pre: 78 %
FVC-Post: 3.42 L
FVC-Pre: 3.33 L
Post FEV1/FVC ratio: 69 %
Post FEV6/FVC ratio: 100 %
Pre FEV1/FVC ratio: 75 %
Pre FEV6/FVC Ratio: 100 %
RV % pred: 115 %
RV: 2.58 L
TLC % pred: 90 %
TLC: 5.95 L

## 2021-12-13 NOTE — Progress Notes (Unsigned)
Gary Jimenez    [id number]    1956-02-23  Primary Care Physician:Robbins, Audree Camel, MD  Referring Physician: Myrlene Broker, MD [place],  [place] [address]  Chief complaint: Consult for dyspnea  HPI: 66 year old with history of hyperlipidemia, type 2 diabetes, coronary artery disease.  Referred here for evaluation of chronic dyspnea on exertion.  He has exertional dyspnea, no symptoms at rest.  Occasional cough with mucus production.  No wheezing  He is followed by Dr. Virgina Jock, cardiology with a recent right and left heart cath in January 2023 with severe mid LAD stenosis.  Unable to wire LAD due to severe toruosity, thus aborted, started on medical therapy  History notable for OSA for which he uses CPAP He is a never smoker with no significant exposures  Outpatient Encounter Medications as of 12/13/2021  Medication Sig   aspirin EC 81 MG tablet Take 1 tablet (81 mg total) by mouth daily.   ezetimibe (ZETIA) 10 MG tablet Take 10 mg by mouth daily.   glipiZIDE (GLUCOTROL) 5 MG tablet Take by mouth.   isosorbide mononitrate (IMDUR) 30 MG 24 hr tablet Take 1 tablet (30 mg total) by mouth daily.   metFORMIN (GLUCOPHAGE-XR) 500 MG 24 hr tablet Take 1,000 mg by mouth 2 (two) times daily.   Olmesartan-amLODIPine-HCTZ 40-10-25 MG TABS Take 1 tablet by mouth daily.   omeprazole (PRILOSEC) 40 MG capsule Take 40 mg by mouth daily.   pravastatin (PRAVACHOL) 40 MG tablet Take 1 tablet (40 mg total) by mouth every evening.   vitamin B-12 (CYANOCOBALAMIN) 1000 MCG tablet Take 1,000 mcg by mouth daily.   Vitamin D3 (VITAMIN D) 25 MCG tablet Take 1,000 Units by mouth daily.   [DISCONTINUED] glimepiride (AMARYL) 2 MG tablet Take 2 mg by mouth daily.   No facility-administered encounter medications on file as of 12/13/2021.    Allergies as of 12/13/2021 - Review Complete 10/28/2021  Allergen Reaction Noted   Cephalexin Other (See Comments) 01/05/2016     Past Medical History:  Diagnosis Date   Diabetes mellitus without complication ([place])    Hyperlipidemia    Hypertension     Past Surgical History:  Procedure Laterality Date   RIGHT/LEFT HEART CATH AND CORONARY ANGIOGRAPHY N/A 10/05/2021   Procedure: RIGHT/LEFT HEART CATH AND CORONARY ANGIOGRAPHY;  Surgeon: Nigel Mormon, MD;  Location: [facility];  Service: Cardiovascular;  Laterality: N/A;    Family History  Problem Relation Age of Onset   Hyperlipidemia Mother    Hypertension Mother    Heart disease Mother    Heart disease Father     Social History   Socioeconomic History   Marital status: Married    Spouse name: Not on file   Number of children: 2   Years of education: Not on file   Highest education level: Not on file  Occupational History   Not on file  Tobacco Use   Smoking status: Never   Smokeless tobacco: Never  Vaping Use   Vaping Use: Never used  Substance and Sexual Activity   Alcohol use: Not Currently   Drug use: Not Currently   Sexual activity: Not on file  Other Topics Concern   Not on file  Social History Narrative   Not on file   Social Determinants of Health   Financial Resource Strain: Not on file  Food Insecurity: Not on file  Transportation Needs: Not on file  Physical Activity: Not on file  Stress: Not on file  Social Connections: Not on file  Intimate Partner Violence: Not on file    Review of systems: Review of Systems  Constitutional: Negative for fever and chills.  HENT: Negative.   Eyes: Negative for blurred vision.  Respiratory: as per HPI  Cardiovascular: Negative for chest pain and palpitations.  Gastrointestinal: Negative for vomiting, diarrhea, blood per rectum. Genitourinary: Negative for dysuria, urgency, frequency and hematuria.  Musculoskeletal: Negative for myalgias, back pain and joint pain.  Skin: Negative for itching and rash.  Neurological: Negative for dizziness, tremors, focal weakness,  seizures and loss of consciousness.  Endo/Heme/Allergies: Negative for environmental allergies.  Psychiatric/Behavioral: Negative for depression, suicidal ideas and hallucinations.  All other systems reviewed and are negative.  Physical Exam: Blood pressure 132/66, pulse 75, temperature 98.3 F (36.8 C), temperature source Oral, height 5\' 7"  (1.702 m), weight 258 lb 6.4 oz (117.2 kg), SpO2 98 %. Gen:      No acute distress HEENT:  EOMI, sclera anicteric Neck:     No masses; no thyromegaly Lungs:    Clear to auscultation bilaterally; normal respiratory effort CV:         Regular rate and rhythm; no murmurs Abd:      + bowel sounds; soft, non-tender; no palpable masses, no distension Ext:    No edema; adequate peripheral perfusion Skin:      Warm and dry; no rash Neuro: alert and oriented x 3 Psych: normal mood and affect  Data Reviewed: Imaging:   PFTs:  Labs:  Assessment:  Evaluation for exertional dyspnea May be related to cardiac issues He does not have significant smoking history so suspicion for COPD slow We will get a chest x-ray today and PFTs for better evaluation of the lungs Return to clinic in 1 month for reevaluation   Plan/Recommendations: Chest x-ray, PFTs  Marshell Garfinkel MD [facility] 12/13/2021, 4:22 PM  CC: Myrlene Broker, MD

## 2021-12-13 NOTE — Progress Notes (Signed)
Full PFT completed today ? ?

## 2021-12-18 ENCOUNTER — Encounter: Payer: Self-pay | Admitting: Pulmonary Disease

## 2021-12-20 ENCOUNTER — Other Ambulatory Visit: Payer: Self-pay

## 2021-12-20 ENCOUNTER — Encounter: Payer: Self-pay | Admitting: Cardiology

## 2021-12-20 ENCOUNTER — Ambulatory Visit: Payer: HMO | Admitting: Cardiology

## 2021-12-20 VITALS — BP 132/83 | HR 91 | Temp 98.1°F | Resp 16 | Ht 68.0 in | Wt 256.0 lb

## 2021-12-20 DIAGNOSIS — I10 Essential (primary) hypertension: Secondary | ICD-10-CM

## 2021-12-20 DIAGNOSIS — E782 Mixed hyperlipidemia: Secondary | ICD-10-CM

## 2021-12-20 DIAGNOSIS — I25118 Atherosclerotic heart disease of native coronary artery with other forms of angina pectoris: Secondary | ICD-10-CM

## 2021-12-20 DIAGNOSIS — E119 Type 2 diabetes mellitus without complications: Secondary | ICD-10-CM

## 2021-12-20 NOTE — Progress Notes (Signed)
? ? ?Patient self referred for dyspnea on exertion, chest pain, abnormal stress test ? ?Subjective:  ? ?Gary Jimenez, male    DOB: 1955-10-19, 66 y.o.   MRN: [id number] ? ?Chief Complaint  ?Patient presents with  ? Coronary Artery Disease  ? Follow-up  ? ? ?HPI ? ?66 y/o Caucasian male with hypertension, hyperlipidemia, uncontrolled type 2 DM, single vessel CAD (mid LAD) ? ?Patient has continued to have exertional dyspnea with minimal activity. He was seen by pulmonologist Dr. Vaughan Browner. Workup was unremarkable.  ? ?Previous attempt at mid LAD PCI was unsuccessful due to severe LAD tortuosity.  ? ?Initial consultation visit 09/2021: ?Patient is here today with his wife.  Patient worked at [employer] for several years, retired in 2019.  He is to be quite active doing yard work over several acres without much difficulty, until summer 2022.  Over the last 6 months, he has had progressive worsening of dyspnea on exertion.  Now, he struggles to do minimal yard work.  He has only occasional episodes of chest pain on exertion, but this is not a common feature.  He does have controlled hypertension and hyperlipidemia.  He has had stable weight around 260 pounds until about a month ago, and he is gained 6 pounds.  He has OSA, regularly uses CPAP.  He has controlled hypertension and type 2 diabetes mellitus.  Lipids are reasonably well controlled.  He has not tolerated statins-Crestor Lipitor, in the past due to myalgias.  He currently takes Zetia. ? ?Patient was last seen by cardiologist Dr. Abran Richard at [facility] on 09/01/2021.  Patient underwent work-up for chest pain and dyspnea at their office.  Echocardiogram showed normal EF, no significant valvular abnormality.  Stress test showed moderate size inferior wall, partially reversible defect.  Given the patient's dyspnea had improved and he has not had any significant recurrent chest discomfort, option of medical management versus invasive  work-up was offered to the patient.  However, patient wanted to seek second opinion with our practice since his wife is also established patient of ours. ? ? ?Current Outpatient Medications:  ?  aspirin EC 81 MG tablet, Take 1 tablet (81 mg total) by mouth daily., Disp: 90 tablet, Rfl: 3 ?  ezetimibe (ZETIA) 10 MG tablet, Take 10 mg by mouth daily., Disp: , Rfl:  ?  glipiZIDE (GLUCOTROL) 5 MG tablet, Take by mouth., Disp: , Rfl:  ?  isosorbide mononitrate (IMDUR) 30 MG 24 hr tablet, Take 1 tablet (30 mg total) by mouth daily., Disp: 60 tablet, Rfl: 3 ?  metFORMIN (GLUCOPHAGE-XR) 500 MG 24 hr tablet, Take 1,000 mg by mouth 2 (two) times daily., Disp: , Rfl:  ?  Olmesartan-amLODIPine-HCTZ 40-10-25 MG TABS, Take 1 tablet by mouth daily., Disp: , Rfl:  ?  omeprazole (PRILOSEC) 40 MG capsule, Take 40 mg by mouth daily., Disp: , Rfl:  ?  pravastatin (PRAVACHOL) 40 MG tablet, Take 1 tablet (40 mg total) by mouth every evening., Disp: 90 tablet, Rfl: 2 ?  vitamin B-12 (CYANOCOBALAMIN) 1000 MCG tablet, Take 1,000 mcg by mouth daily., Disp: , Rfl:  ?  Vitamin D3 (VITAMIN D) 25 MCG tablet, Take 1,000 Units by mouth daily., Disp: , Rfl:  ? ? ? ?Cardiovascular and other pertinent studies: ? ?LHC/RHC 10/20/2021: ?LM: Normal ?LAD: Tortuous ?         Mid LAD 80% stenosis ?Lcx: No significant disease ?Ramus: No significant disease ?RCA: Large, tortuous. No significant disease ?  ?Normal  filling pressures ?  ?Unable to wire LAD due to tortuosity and angulated takeoff. ?Aborted due to amount of radiation and contrast used. ?Recommend medical therapy for now. Added Aspirin 81 mg and metoprolol succinate 25 mg daily. ?Will re-attempt down the road, if symptoms do not improve ? ?Coronary angiography 10/05/2021: ?LM: Normal ?LAD: Tortuous ?         Mid LAD 80% stenosis ?Lcx: No significant disease ?Ramus: No significant disease ?RCA: Large, tortuous. No significant disease ?  ?Normal filling pressures ?  ?Unable to wire LAD due to tortuosity  and angulated takeoff. ?Aborted due to amount of radiation and contrast used. ?Recommend medical therapy for now. Added Aspirin 81 mg and metoprolol succinate 25 mg daily. ?Will re-attempt down the road, if symptoms do not improve ? ?EKG 09/23/2021: ?Sinus rhythm 90 bpm ?Normal EKG ? ?Echocardiogram 07/27/2021: ?Left ventricular systolic function is normal.  ?LV ejection fraction = 55-60%.  ?There is aortic valve sclerosis.  ?There is no aortic stenosis.  ?There is no comparison study available.  ? ? ?Recent labs: ?10/20/2021: ?Chol 154, TG 221, HDL 45,  LDL 73 ? ?09/23/2021: ?Glucose 237, BUN/Cr 25/1.36. EGFR 58. Na/K 144/4.7.  ?H/H 14/45. MCV 90. Platelets 246 ? ?06/01/2021: ?Glucose 140.  BUN/creatinine 36/1.56.  eGFR 49.  NA/K 138/4.7.  ALT 53.  Rest of the CMP normal. ?H/H 14/42.  MCV 89.  Platelets 245. ?HbA1c 7.0% ?TSH 0.84 normal ? ?12/2020: ?Chol 167, TG 192, HDL 43,  LDL 104 ? ? ?Review of Systems  ?Constitutional: Positive for malaise/fatigue.  ?Cardiovascular:  Positive for dyspnea on exertion. Negative for chest pain, leg swelling, palpitations and syncope.  ?Musculoskeletal:  Positive for joint pain.  ? ?   ? ? ?Vitals:  ? 12/20/21 1303  ?BP: 132/83  ?Pulse: 91  ?Resp: 16  ?Temp: 98.1 ?F (36.7 ?C)  ?SpO2: 98%  ? ? ? ?Body mass index is 38.92 kg/m?. Danley Danker Weights  ? 12/20/21 1303  ?Weight: 256 lb (116.1 kg)  ? ? ? ?Objective:  ? Physical Exam ?Vitals and nursing note reviewed.  ?Constitutional:   ?   General: He is not in acute distress. ?   Appearance: He is obese.  ?Neck:  ?   Vascular: No JVD.  ?Cardiovascular:  ?   Rate and Rhythm: Normal rate and regular rhythm.  ?   Pulses: Normal pulses.  ?   Heart sounds: Normal heart sounds. No murmur heard. ?Pulmonary:  ?   Effort: Pulmonary effort is normal.  ?   Breath sounds: Normal breath sounds. No wheezing or rales.  ?Musculoskeletal:  ?   Right lower leg: No edema.  ?   Left lower leg: No edema.  ? ? ?   ?  ICD-10-CM   ?1. Coronary artery disease  involving native coronary artery of native heart with other form of angina pectoris ([place])  I25.118 CBC  ?  Basic metabolic panel  ?  ?2. Type 2 diabetes mellitus without complication, with long-term current use of insulin (HCC)  E11.9   ? Z79.4   ?  ?3. Essential hypertension  I10   ?  ?4. Mixed hyperlipidemia  E78.2   ?  ? ? ? ?Assessment & Recommendations:  ? ? ?66 year old Caucasian male with hypertension, hyperlipidemia, type 2 diabetes mellitus, CAD  ? ? ?CAD with angina equivalent/exertional dyspnea: ?Severe mid LAD stenosis (10/2021), unable to wire LAD due to tortuosity and angulated takeoff. ?No significant pulmonology etiology. ?Continue medical management with aspirin 81 mg,  Imdur 30 mg, amlodipine as part of his combination pill. ?Did not tolerate metoprolol due to fatigue.  ?LDL 73 on pravastatin 20 mg daily, Zetia 10 mg daily, intolerant to Crestor and Lipitor due to severe myalgias. ? ?I discussed options of repeat attempt to mid LAD PCI vs referral for single vessel CABG. Risk of complications higher due to severe tortuosity. Given ongoing symptom on optimal medical therapy, patient would like to proceed with high risk PCI attempt. He does not want single vessel CABG unless another PCI attempt is unsuccessful. ? ?Will use femoral access to increase chances of success. I will also seek assistance from another interventional cardiologist (Dr. Einar Gip, my partner), if I am unsuccessful.  ? ?Mixed hyperlipidemia: ?As above. ? ?Hypertension: ?Well-controlled ? ?Type 2 diabetes mellitus: ?Recently uncontrolled. Had f/u w/PCP w/recent improvement in blood sugar. ? ?F/u after cath ? ? ?Nigel Mormon, MD ?Pager: [phone] ?Office: [phone] ?

## 2021-12-21 LAB — CBC
Hematocrit: 46.3 % (ref 37.5–51.0)
Hemoglobin: 15.3 g/dL (ref 13.0–17.7)
MCH: 29.4 pg (ref 26.6–33.0)
MCHC: 33 g/dL (ref 31.5–35.7)
MCV: 89 fL (ref 79–97)
Platelets: 283 10*3/uL (ref 150–450)
RBC: 5.21 x10E6/uL (ref 4.14–5.80)
RDW: 13.5 % (ref 11.6–15.4)
WBC: 6.9 10*3/uL (ref 3.4–10.8)

## 2021-12-21 LAB — BASIC METABOLIC PANEL
BUN/Creatinine Ratio: 17 (ref 10–24)
BUN: 21 mg/dL (ref 8–27)
CO2: 22 mmol/L (ref 20–29)
Calcium: 10.1 mg/dL (ref 8.6–10.2)
Chloride: 102 mmol/L (ref 96–106)
Creatinine, Ser: 1.25 mg/dL (ref 0.76–1.27)
Glucose: 119 mg/dL — ABNORMAL HIGH (ref 70–99)
Potassium: 4.9 mmol/L (ref 3.5–5.2)
Sodium: 143 mmol/L (ref 134–144)
eGFR: 64 mL/min/{1.73_m2} (ref >59)

## 2021-12-28 ENCOUNTER — Ambulatory Visit (HOSPITAL_COMMUNITY)
Admission: RE | Admit: 2021-12-28 | Discharge: 2021-12-29 | Disposition: A | Discharge status: Home / Self Care | Payer: HMO | Attending: Cardiology | Admitting: Cardiology

## 2021-12-28 ENCOUNTER — Other Ambulatory Visit: Payer: Self-pay

## 2021-12-28 ENCOUNTER — Encounter (HOSPITAL_COMMUNITY)
Admission: RE | Disposition: A | Discharge status: Home / Self Care | Payer: Self-pay | Source: Home / Self Care | Attending: Cardiology

## 2021-12-28 DIAGNOSIS — I10 Essential (primary) hypertension: Secondary | ICD-10-CM | POA: Insufficient documentation

## 2021-12-28 DIAGNOSIS — Z79899 Other long term (current) drug therapy: Secondary | ICD-10-CM | POA: Diagnosis not present

## 2021-12-28 DIAGNOSIS — E782 Mixed hyperlipidemia: Secondary | ICD-10-CM | POA: Insufficient documentation

## 2021-12-28 DIAGNOSIS — E1165 Type 2 diabetes mellitus with hyperglycemia: Secondary | ICD-10-CM | POA: Diagnosis not present

## 2021-12-28 DIAGNOSIS — I25118 Atherosclerotic heart disease of native coronary artery with other forms of angina pectoris: Secondary | ICD-10-CM | POA: Insufficient documentation

## 2021-12-28 DIAGNOSIS — Z794 Long term (current) use of insulin: Secondary | ICD-10-CM | POA: Insufficient documentation

## 2021-12-28 DIAGNOSIS — Z7984 Long term (current) use of oral hypoglycemic drugs: Secondary | ICD-10-CM | POA: Diagnosis not present

## 2021-12-28 DIAGNOSIS — Z7982 Long term (current) use of aspirin: Secondary | ICD-10-CM | POA: Insufficient documentation

## 2021-12-28 DIAGNOSIS — G4733 Obstructive sleep apnea (adult) (pediatric): Secondary | ICD-10-CM | POA: Insufficient documentation

## 2021-12-28 DIAGNOSIS — Z9861 Coronary angioplasty status: Secondary | ICD-10-CM

## 2021-12-28 DIAGNOSIS — R0609 Other forms of dyspnea: Secondary | ICD-10-CM | POA: Diagnosis not present

## 2021-12-28 DIAGNOSIS — I2510 Atherosclerotic heart disease of native coronary artery without angina pectoris: Secondary | ICD-10-CM | POA: Diagnosis present

## 2021-12-28 HISTORY — PX: INTRAVASCULAR ULTRASOUND/IVUS: CATH118244

## 2021-12-28 HISTORY — PX: CORONARY STENT INTERVENTION: CATH118234

## 2021-12-28 HISTORY — PX: LEFT HEART CATH AND CORONARY ANGIOGRAPHY: CATH118249

## 2021-12-28 LAB — CBC
HCT: 35.8 % — ABNORMAL LOW (ref 39.0–52.0)
Hemoglobin: 12.2 g/dL — ABNORMAL LOW (ref 13.0–17.0)
MCH: 30.4 pg (ref 26.0–34.0)
MCHC: 34.1 g/dL (ref 30.0–36.0)
MCV: 89.3 fL (ref 80.0–100.0)
Platelets: 218 10*3/uL (ref 150–400)
RBC: 4.01 MIL/uL — ABNORMAL LOW (ref 4.22–5.81)
RDW: 13.7 % (ref 11.5–15.5)
WBC: 7.5 10*3/uL (ref 4.0–10.5)
nRBC: 0 % (ref 0.0–0.2)

## 2021-12-28 LAB — TYPE AND SCREEN
ABO/RH(D): O POS
Antibody Screen: NEGATIVE

## 2021-12-28 LAB — BASIC METABOLIC PANEL
Anion gap: 10 (ref 5–15)
BUN: 29 mg/dL — ABNORMAL HIGH (ref 8–23)
CO2: 19 mmol/L — ABNORMAL LOW (ref 22–32)
Calcium: 8.5 mg/dL — ABNORMAL LOW (ref 8.9–10.3)
Chloride: 107 mmol/L (ref 98–111)
Creatinine, Ser: 1.22 mg/dL (ref 0.61–1.24)
GFR, Estimated: >60 mL/min (ref >60)
Glucose, Bld: 141 mg/dL — ABNORMAL HIGH (ref 70–99)
Potassium: 3.8 mmol/L (ref 3.5–5.1)
Sodium: 136 mmol/L (ref 135–145)

## 2021-12-28 LAB — HEMOGLOBIN A1C
Hgb A1c MFr Bld: 7.3 % — ABNORMAL HIGH (ref 4.8–5.6)
Mean Plasma Glucose: 162.81 mg/dL

## 2021-12-28 LAB — GLUCOSE, CAPILLARY
Glucose-Capillary: 132 mg/dL — ABNORMAL HIGH (ref 70–99)
Glucose-Capillary: 138 mg/dL — ABNORMAL HIGH (ref 70–99)
Glucose-Capillary: 261 mg/dL — ABNORMAL HIGH (ref 70–99)
Glucose-Capillary: 121 mg/dL — ABNORMAL HIGH (ref 70–99)

## 2021-12-28 LAB — ABO/RH: ABO/RH(D): O POS

## 2021-12-28 SURGERY — CORONARY STENT INTERVENTION
Anesthesia: LOCAL

## 2021-12-28 MED ORDER — SODIUM CHLORIDE 0.9% FLUSH: 3.0000 mL | Freq: Two times a day (BID) | INTRAVENOUS | Status: DC

## 2021-12-28 MED ORDER — HEPARIN SODIUM (PORCINE) 1000 UNIT/ML IJ SOLN
INTRAMUSCULAR | Status: DC | PRN
  Administered 2021-12-28: 3000 [IU] via INTRAVENOUS
  Administered 2021-12-28: 10000 [IU] via INTRAVENOUS

## 2021-12-28 MED ORDER — SODIUM CHLORIDE 0.9 % IV SOLN: INTRAVENOUS | Status: AC

## 2021-12-28 MED ORDER — SODIUM CHLORIDE 0.9 % IV SOLN: 250.0000 mL | INTRAVENOUS | Status: DC | PRN

## 2021-12-28 MED ORDER — CLOPIDOGREL BISULFATE 75 MG PO TABS
75.0000 mg | ORAL_TABLET | Freq: Every day | ORAL | 1 refills | Status: DC
  Filled 2021-12-28: qty 30, 30d supply, fill #0

## 2021-12-28 MED ORDER — LIDOCAINE HCL (PF) 1 % IJ SOLN
INTRAMUSCULAR | Status: DC | PRN
  Administered 2021-12-28 (×2): 15 mL

## 2021-12-28 MED ORDER — HEPARIN (PORCINE) IN NACL 1000-0.9 UT/500ML-% IV SOLN
INTRAVENOUS | Status: AC
  Filled 2021-12-28: qty 500

## 2021-12-28 MED ORDER — SODIUM CHLORIDE 0.9 % WEIGHT BASED INFUSION: 1.0000 mL/kg/h | INTRAVENOUS | Status: DC

## 2021-12-28 MED ORDER — NITROGLYCERIN 1 MG/10 ML FOR IR/CATH LAB
INTRA_ARTERIAL | Status: DC | PRN
  Administered 2021-12-28: 200 ug via INTRACORONARY

## 2021-12-28 MED ORDER — EZETIMIBE 10 MG PO TABS
10.0000 mg | ORAL_TABLET | Freq: Every day | ORAL | Status: DC
  Administered 2021-12-29: 10 mg via ORAL
  Filled 2021-12-28: qty 1

## 2021-12-28 MED ORDER — SODIUM CHLORIDE 0.9 % IV BOLUS
INTRAVENOUS | Status: AC | PRN
  Administered 2021-12-28 (×2): 500 mL via INTRAVENOUS

## 2021-12-28 MED ORDER — HYDRALAZINE HCL 20 MG/ML IJ SOLN: 10.0000 mg | INTRAMUSCULAR | Status: AC | PRN

## 2021-12-28 MED ORDER — PROTAMINE SULFATE 10 MG/ML IV SOLN
INTRAVENOUS | Status: DC | PRN
  Administered 2021-12-28: 20 mg via INTRAVENOUS

## 2021-12-28 MED ORDER — NITROGLYCERIN 1 MG/10 ML FOR IR/CATH LAB
INTRA_ARTERIAL | Status: AC
Start: 2021-12-28 — End: ?
  Filled 2021-12-28: qty 10

## 2021-12-28 MED ORDER — CLOPIDOGREL BISULFATE 300 MG PO TABS
ORAL_TABLET | ORAL | Status: AC
  Filled 2021-12-28: qty 2

## 2021-12-28 MED ORDER — PANTOPRAZOLE SODIUM 40 MG PO TBEC
40.0000 mg | DELAYED_RELEASE_TABLET | Freq: Every day | ORAL | Status: DC
  Administered 2021-12-29: 40 mg via ORAL
  Filled 2021-12-28: qty 1

## 2021-12-28 MED ORDER — MIDAZOLAM HCL 2 MG/2ML IJ SOLN
INTRAMUSCULAR | Status: DC | PRN
  Administered 2021-12-28: 1 mg via INTRAVENOUS

## 2021-12-28 MED ORDER — IOHEXOL 350 MG/ML SOLN
INTRAVENOUS | Status: DC | PRN
  Administered 2021-12-28: 130 mL

## 2021-12-28 MED ORDER — MIDAZOLAM HCL 2 MG/2ML IJ SOLN
INTRAMUSCULAR | Status: AC
  Filled 2021-12-28: qty 2

## 2021-12-28 MED ORDER — LABETALOL HCL 5 MG/ML IV SOLN: 10.0000 mg | INTRAVENOUS | Status: AC | PRN

## 2021-12-28 MED ORDER — PROTAMINE SULFATE 10 MG/ML IV SOLN
INTRAVENOUS | Status: AC
  Filled 2021-12-28: qty 5

## 2021-12-28 MED ORDER — FENTANYL CITRATE (PF) 100 MCG/2ML IJ SOLN
INTRAMUSCULAR | Status: DC | PRN
  Administered 2021-12-28: 50 ug via INTRAVENOUS

## 2021-12-28 MED ORDER — ASPIRIN 81 MG PO CHEW: 81.0000 mg | CHEWABLE_TABLET | ORAL | Status: DC

## 2021-12-28 MED ORDER — HEPARIN (PORCINE) IN NACL 1000-0.9 UT/500ML-% IV SOLN
INTRAVENOUS | Status: DC | PRN
  Administered 2021-12-28 (×3): 500 mL

## 2021-12-28 MED ORDER — CLOPIDOGREL BISULFATE 300 MG PO TABS
ORAL_TABLET | ORAL | Status: DC | PRN
  Administered 2021-12-28: 600 mg via ORAL

## 2021-12-28 MED ORDER — SODIUM CHLORIDE 0.9% FLUSH
3.0000 mL | Freq: Two times a day (BID) | INTRAVENOUS | Status: DC
  Administered 2021-12-28 – 2021-12-29 (×2): 3 mL via INTRAVENOUS

## 2021-12-28 MED ORDER — SODIUM CHLORIDE 0.9% FLUSH: 3.0000 mL | INTRAVENOUS | Status: DC | PRN

## 2021-12-28 MED ORDER — LIDOCAINE HCL (PF) 1 % IJ SOLN
INTRAMUSCULAR | Status: AC
Start: 2021-12-28 — End: ?
  Filled 2021-12-28: qty 30

## 2021-12-28 MED ORDER — HEPARIN (PORCINE) IN NACL 1000-0.9 UT/500ML-% IV SOLN
INTRAVENOUS | Status: AC
Start: 2021-12-28 — End: ?
  Filled 2021-12-28: qty 1000

## 2021-12-28 MED ORDER — ASPIRIN EC 81 MG PO TBEC
81.0000 mg | DELAYED_RELEASE_TABLET | Freq: Every day | ORAL | Status: DC
  Administered 2021-12-29: 81 mg via ORAL
  Filled 2021-12-28: qty 1

## 2021-12-28 MED ORDER — HEPARIN SODIUM (PORCINE) 1000 UNIT/ML IJ SOLN
INTRAMUSCULAR | Status: AC
  Filled 2021-12-28: qty 10

## 2021-12-28 MED ORDER — PRAVASTATIN SODIUM 40 MG PO TABS: 40.0000 mg | ORAL_TABLET | Freq: Every evening | ORAL | Status: DC

## 2021-12-28 MED ORDER — AMLODIPINE BESYLATE 10 MG PO TABS
10.0000 mg | ORAL_TABLET | Freq: Every day | ORAL | Status: DC
  Administered 2021-12-29: 10 mg via ORAL
  Filled 2021-12-28: qty 1

## 2021-12-28 MED ORDER — SODIUM CHLORIDE 0.9 % WEIGHT BASED INFUSION
3.0000 mL/kg/h | INTRAVENOUS | Status: DC
  Administered 2021-12-28: 3 mL/kg/h via INTRAVENOUS

## 2021-12-28 MED ORDER — INSULIN ASPART 100 UNIT/ML IJ SOLN
6.0000 [IU] | Freq: Three times a day (TID) | INTRAMUSCULAR | Status: DC
  Administered 2021-12-28 – 2021-12-29 (×3): 6 [IU] via SUBCUTANEOUS

## 2021-12-28 MED ORDER — FENTANYL CITRATE (PF) 100 MCG/2ML IJ SOLN
INTRAMUSCULAR | Status: AC
  Filled 2021-12-28: qty 2

## 2021-12-28 MED ORDER — ONDANSETRON HCL 4 MG/2ML IJ SOLN: 4.0000 mg | Freq: Four times a day (QID) | INTRAMUSCULAR | Status: DC | PRN

## 2021-12-28 MED ORDER — ACETAMINOPHEN 325 MG PO TABS: 650.0000 mg | ORAL_TABLET | ORAL | Status: DC | PRN

## 2021-12-28 MED ORDER — FLUTICASONE PROPIONATE 50 MCG/ACT NA SUSP
1.0000 | Freq: Every day | NASAL | Status: DC | PRN
  Filled 2021-12-28: qty 16

## 2021-12-28 MED ORDER — INSULIN ASPART 100 UNIT/ML IJ SOLN: 0.0000 [IU] | Freq: Every day | INTRAMUSCULAR | Status: DC

## 2021-12-28 MED ORDER — INSULIN ASPART 100 UNIT/ML IJ SOLN
0.0000 [IU] | Freq: Three times a day (TID) | INTRAMUSCULAR | Status: DC
  Administered 2021-12-28: 11 [IU] via SUBCUTANEOUS
  Administered 2021-12-29: 7 [IU] via SUBCUTANEOUS
  Administered 2021-12-29: 4 [IU] via SUBCUTANEOUS

## 2021-12-28 MED ORDER — CLOPIDOGREL BISULFATE 75 MG PO TABS
75.0000 mg | ORAL_TABLET | Freq: Every day | ORAL | Status: DC
  Administered 2021-12-29: 75 mg via ORAL
  Filled 2021-12-28: qty 1

## 2021-12-28 SURGICAL SUPPLY — 30 items
BALLN SAPPHIRE 2.5X15 (BALLOONS) ×2
BALLN SCOREFLEX 2.75X10 (BALLOONS) ×2
BALLN ~~LOC~~ EUPHORA RX 3.25X8 (BALLOONS) ×2
BALLOON SAPPHIRE 2.5X15 (BALLOONS) IMPLANT
BALLOON SCOREFLEX 2.75X10 (BALLOONS) IMPLANT
BALLOON ~~LOC~~ EUPHORA RX 3.25X8 (BALLOONS) IMPLANT
CATH 5FR JR4 DIAGNOSTIC (CATHETERS) ×1 IMPLANT
CATH CROSS OVER TEMPO 5F (CATHETERS) ×1 IMPLANT
CATH LAUNCHER 6FR AL.75 (CATHETERS) ×1 IMPLANT
CATH LAUNCHER 6FR AL1 (CATHETERS) IMPLANT
CATH OPTICROSS HD (CATHETERS) ×1 IMPLANT
CATH VENTURE RX (CATHETERS) ×1 IMPLANT
CATHETER LAUNCHER 6FR AL1 (CATHETERS) ×2
ELECT DEFIB PAD ADLT CADENCE (PAD) ×1 IMPLANT
GLIDEWIRE ADV .035X260CM (WIRE) ×1 IMPLANT
KIT ENCORE 26 ADVANTAGE (KITS) ×1 IMPLANT
KIT HEART LEFT (KITS) ×2 IMPLANT
KIT MICROPUNCTURE NIT STIFF (SHEATH) ×3 IMPLANT
PACK CARDIAC CATHETERIZATION (CUSTOM PROCEDURE TRAY) ×2 IMPLANT
SHEATH PINNACLE 5F 10CM (SHEATH) ×1 IMPLANT
SHEATH PINNACLE 6F 10CM (SHEATH) ×2 IMPLANT
SHEATH PROBE COVER 6X72 (BAG) ×1 IMPLANT
SLED PULL BACK IVUS (MISCELLANEOUS) ×1 IMPLANT
STENT ONYX FRONTIER 2.75X15 (Permanent Stent) ×1 IMPLANT
TRANSDUCER W/STOPCOCK (MISCELLANEOUS) ×2 IMPLANT
TUBING CIL FLEX 10 FLL-RA (TUBING) ×2 IMPLANT
WIRE ASAHI PROWATER 180CM (WIRE) ×1 IMPLANT
WIRE EMERALD 3MM-J .035X150CM (WIRE) ×1 IMPLANT
WIRE HI TORQ BMW 190CM (WIRE) ×1 IMPLANT
WIRE HI TORQ VERSACORE-J 145CM (WIRE) ×1 IMPLANT

## 2021-12-28 NOTE — Interval H&P Note (Signed)
History and Physical Interval Note: ? ?12/28/2021 ?10:47 AM ? ?Gary Jimenez  has presented today for surgery, with the diagnosis of cad.  The various methods of treatment have been discussed with the patient and family. After consideration of risks, benefits and other options for treatment, the patient has consented to  Procedure(s): ?CORONARY STENT INTERVENTION (N/A) as a surgical intervention.  The patient's history has been reviewed, patient examined, no change in status, stable for surgery.  I have reviewed the patient's chart and labs.  Questions were answered to the patient's satisfaction.   ?2016/2017 Appropriate Use Criteria for Coronary Revascularization ?Symptom Status: Ischemic Symptoms  ?Non-invasive Testing: High risk  ?If no or indeterminate stress test, FFR/iFR results in all diseased vessels: N/A  ?Diabetes Mellitus: Yes  ?S/P CABG: No  ?Antianginal therapy (number of long-acting drugs): >=2  ?Patient undergoing renal transplant: No  ?Patient undergoing percutaneous valve procedure: No  ?1 Vessel Disease PCI CABG  ?No proximal LAD involvement, No proximal left dominant LCX involvement A (8); Indication 2 M (6); Indication 2  ?Proximal left dominant LCX involvement A (8); Indication 5 A (8); Indication 5  ?Proximal LAD involvement A (8); Indication 5 A (8); Indication 5  ?2 Vessel Disease  ?No proximal LAD involvement A (8); Indication 8 A (7); Indication 8  ?Proximal LAD involvement A (8); Indication 14 A (9); Indication 14  ?3 Vessel Disease  ?Low disease complexity (e.g., focal stenoses, SYNTAX <=22) A (7); Indication 19 A (9); Indication 19  ?Intermediate or high disease complexity (e.g., SYNTAX >=23) M (6); Indication 23 A (9); Indication 23  ?Left Main Disease  ?Isolated LMCA disease: ostial or midshaft A (7); Indication 24 A (9); Indication 24  ?Isolated LMCA disease: bifurcation involvement M (6); Indication 25 A (9); Indication 25  ?LMCA ostial or midshaft, concurrent low disease burden  multivessel disease (e.g., 1-2 additional focal stenoses, SYNTAX <=22) A (7); Indication 26 A (9); Indication 26  ?LMCA ostial or midshaft, concurrent intermediate or high disease burden multivessel disease (e.g., 1-2 additional bifurcation stenoses, long stenoses, SYNTAX >=23) M (4); Indication 27 A (9); Indication 27  ?LMCA bifurcation involvement, concurrent low disease burden multivessel disease (e.g., 1-2 additional focal stenoses, SYNTAX <=22) M (6); Indication 28 A (9); Indication 28  ?LMCA bifurcation involvement, concurrent intermediate or high disease burden multivessel disease (e.g., 1-2 additional bifurcation stenoses, long stenoses, SYNTAX >=23) R (3); Indication 29 A (9); Indication 29  ? ? ? ? ?Gary Jimenez ? ? ?

## 2021-12-28 NOTE — Progress Notes (Addendum)
VASCULAR SURGERY: ? ?This patient was undergoing a coronary intervention today and developed bleeding from the right groin.  We were asked to see the patient.  Dr. Donzetta Matters under sterile conditions stop the left groin chart and arteriogram and no specific injury was identified.  I suspect the sheath was partially out which explains the bleeding.  The sheath has been removed and pressure is being held for hemostasis.  I will check back later today to check on the groin and assess the hematoma. ? ?Gae Gallop, MD ?12:42 PM ? ?Addendum:  ? ?Moderate right groin hematoma.  ?No left groin hematoma.  ?Will follow.  ? ?Gae Gallop, MD ?3:40 PM ? ?

## 2021-12-28 NOTE — Progress Notes (Signed)
Paged Dr. Edilia Bo regarding hematoma around right groin site. Hematoma was documented in Dr. Adele Dan note but not on documented on flowsheets and groin felt soft at shift change. I let him know I felt a 5-6cm long hematoma on right side and marked the border. He let me know he is aware of hematoma and it will not delay discharge in the AM. Will continue to monitor pt.  ?

## 2021-12-28 NOTE — H&P (Signed)
OV 12/20/2021 copied for documentation ? ? ? ? ?Patient self referred for dyspnea on exertion, chest pain, abnormal stress test ? ?Subjective:  ? ?Adrienne Mocha, male    DOB: 26-Aug-1956, 66 y.o.   MRN: [id number] ? ?Chief Complaint  ?Patient presents with  ? Coronary Artery Disease  ? Follow-up  ? ? ?HPI ? ?66 y/o Caucasian male with hypertension, hyperlipidemia, uncontrolled type 2 DM, single vessel CAD (mid LAD) ? ?Patient has continued to have exertional dyspnea with minimal activity. He was seen by pulmonologist Dr. Vaughan Browner. Workup was unremarkable.  ? ?Previous attempt at mid LAD PCI was unsuccessful due to severe LAD tortuosity.  ? ?Initial consultation visit 09/2021: ?Patient is here today with his wife.  Patient worked at [employer] for several years, retired in 2019.  He is to be quite active doing yard work over several acres without much difficulty, until summer 2022.  Over the last 6 months, he has had progressive worsening of dyspnea on exertion.  Now, he struggles to do minimal yard work.  He has only occasional episodes of chest pain on exertion, but this is not a common feature.  He does have controlled hypertension and hyperlipidemia.  He has had stable weight around 260 pounds until about a month ago, and he is gained 6 pounds.  He has OSA, regularly uses CPAP.  He has controlled hypertension and type 2 diabetes mellitus.  Lipids are reasonably well controlled.  He has not tolerated statins-Crestor Lipitor, in the past due to myalgias.  He currently takes Zetia. ? ?Patient was last seen by cardiologist Dr. Abran Richard at [facility] on 09/01/2021.  Patient underwent work-up for chest pain and dyspnea at their office.  Echocardiogram showed normal EF, no significant valvular abnormality.  Stress test showed moderate size inferior wall, partially reversible defect.  Given the patient's dyspnea had improved and he has not had any significant recurrent chest discomfort, option of  medical management versus invasive work-up was offered to the patient.  However, patient wanted to seek second opinion with our practice since his wife is also established patient of ours. ? ? ?Current Outpatient Medications:  ?  aspirin EC 81 MG tablet, Take 1 tablet (81 mg total) by mouth daily., Disp: 90 tablet, Rfl: 3 ?  ezetimibe (ZETIA) 10 MG tablet, Take 10 mg by mouth daily., Disp: , Rfl:  ?  glipiZIDE (GLUCOTROL) 5 MG tablet, Take by mouth., Disp: , Rfl:  ?  isosorbide mononitrate (IMDUR) 30 MG 24 hr tablet, Take 1 tablet (30 mg total) by mouth daily., Disp: 60 tablet, Rfl: 3 ?  metFORMIN (GLUCOPHAGE-XR) 500 MG 24 hr tablet, Take 1,000 mg by mouth 2 (two) times daily., Disp: , Rfl:  ?  Olmesartan-amLODIPine-HCTZ 40-10-25 MG TABS, Take 1 tablet by mouth daily., Disp: , Rfl:  ?  omeprazole (PRILOSEC) 40 MG capsule, Take 40 mg by mouth daily., Disp: , Rfl:  ?  pravastatin (PRAVACHOL) 40 MG tablet, Take 1 tablet (40 mg total) by mouth every evening., Disp: 90 tablet, Rfl: 2 ?  vitamin B-12 (CYANOCOBALAMIN) 1000 MCG tablet, Take 1,000 mcg by mouth daily., Disp: , Rfl:  ?  Vitamin D3 (VITAMIN D) 25 MCG tablet, Take 1,000 Units by mouth daily., Disp: , Rfl:  ? ? ? ?Cardiovascular and other pertinent studies: ? ?LHC/RHC 10/20/2021: ?LM: Normal ?LAD: Tortuous ?         Mid LAD 80% stenosis ?Lcx: No significant disease ?Ramus: No significant disease ?RCA: Large,  tortuous. No significant disease ?  ?Normal filling pressures ?  ?Unable to wire LAD due to tortuosity and angulated takeoff. ?Aborted due to amount of radiation and contrast used. ?Recommend medical therapy for now. Added Aspirin 81 mg and metoprolol succinate 25 mg daily. ?Will re-attempt down the road, if symptoms do not improve ? ?Coronary angiography 10/05/2021: ?LM: Normal ?LAD: Tortuous ?         Mid LAD 80% stenosis ?Lcx: No significant disease ?Ramus: No significant disease ?RCA: Large, tortuous. No significant disease ?  ?Normal filling pressures ?   ?Unable to wire LAD due to tortuosity and angulated takeoff. ?Aborted due to amount of radiation and contrast used. ?Recommend medical therapy for now. Added Aspirin 81 mg and metoprolol succinate 25 mg daily. ?Will re-attempt down the road, if symptoms do not improve ? ?EKG 09/23/2021: ?Sinus rhythm 90 bpm ?Normal EKG ? ?Echocardiogram 07/27/2021: ?Left ventricular systolic function is normal.  ?LV ejection fraction = 55-60%.  ?There is aortic valve sclerosis.  ?There is no aortic stenosis.  ?There is no comparison study available.  ? ? ?Recent labs: ?10/20/2021: ?Chol 154, TG 221, HDL 45,  LDL 73 ? ?09/23/2021: ?Glucose 237, BUN/Cr 25/1.36. EGFR 58. Na/K 144/4.7.  ?H/H 14/45. MCV 90. Platelets 246 ? ?06/01/2021: ?Glucose 140.  BUN/creatinine 36/1.56.  eGFR 49.  NA/K 138/4.7.  ALT 53.  Rest of the CMP normal. ?H/H 14/42.  MCV 89.  Platelets 245. ?HbA1c 7.0% ?TSH 0.84 normal ? ?12/2020: ?Chol 167, TG 192, HDL 43,  LDL 104 ? ? ?Review of Systems  ?Constitutional: Positive for malaise/fatigue.  ?Cardiovascular:  Positive for dyspnea on exertion. Negative for chest pain, leg swelling, palpitations and syncope.  ?Musculoskeletal:  Positive for joint pain.  ? ?   ? ? ?Vitals:  ? 12/20/21 1303  ?BP: 132/83  ?Pulse: 91  ?Resp: 16  ?Temp: 98.1 ?F (36.7 ?C)  ?SpO2: 98%  ? ? ? ?Body mass index is 38.92 kg/m?. Danley Danker Weights  ? 12/20/21 1303  ?Weight: 256 lb (116.1 kg)  ? ? ? ?Objective:  ? Physical Exam ?Vitals and nursing note reviewed.  ?Constitutional:   ?   General: He is not in acute distress. ?   Appearance: He is obese.  ?Neck:  ?   Vascular: No JVD.  ?Cardiovascular:  ?   Rate and Rhythm: Normal rate and regular rhythm.  ?   Pulses: Normal pulses.  ?   Heart sounds: Normal heart sounds. No murmur heard. ?Pulmonary:  ?   Effort: Pulmonary effort is normal.  ?   Breath sounds: Normal breath sounds. No wheezing or rales.  ?Musculoskeletal:  ?   Right lower leg: No edema.  ?   Left lower leg: No edema.  ? ? ?   ?  ICD-10-CM    ?1. Coronary artery disease involving native coronary artery of native heart with other form of angina pectoris ([place])  I25.118 CBC  ?  Basic metabolic panel  ?  ?2. Type 2 diabetes mellitus without complication, with long-term current use of insulin (HCC)  E11.9   ? Z79.4   ?  ?3. Essential hypertension  I10   ?  ?4. Mixed hyperlipidemia  E78.2   ?  ? ? ? ?Assessment & Recommendations:  ? ? ?66 year old Caucasian male with hypertension, hyperlipidemia, type 2 diabetes mellitus, CAD  ? ? ?CAD with angina equivalent/exertional dyspnea: ?Severe mid LAD stenosis (10/2021), unable to wire LAD due to tortuosity and angulated takeoff. ?No significant pulmonology etiology. ?  Continue medical management with aspirin 81 mg, Imdur 30 mg, amlodipine as part of his combination pill. ?Did not tolerate metoprolol due to fatigue.  ?LDL 73 on pravastatin 20 mg daily, Zetia 10 mg daily, intolerant to Crestor and Lipitor due to severe myalgias. ? ?I discussed options of repeat attempt to mid LAD PCI vs referral for single vessel CABG. Risk of complications higher due to severe tortuosity. Given ongoing symptom on optimal medical therapy, patient would like to proceed with high risk PCI attempt. He does not want single vessel CABG unless another PCI attempt is unsuccessful. ? ?Will use femoral access to increase chances of success. I will also seek assistance from another interventional cardiologist (Dr. Einar Gip, my partner), if I am unsuccessful.  ? ?Mixed hyperlipidemia: ?As above. ? ?Hypertension: ?Well-controlled ? ?Type 2 diabetes mellitus: ?Recently uncontrolled. Had f/u w/PCP w/recent improvement in blood sugar. ? ?F/u after cath ? ? ?Nigel Mormon, MD ?Pager: [phone] ?Office: [phone] ?

## 2021-12-29 ENCOUNTER — Ambulatory Visit (HOSPITAL_BASED_OUTPATIENT_CLINIC_OR_DEPARTMENT_OTHER): Payer: HMO

## 2021-12-29 ENCOUNTER — Encounter (HOSPITAL_COMMUNITY): Payer: Self-pay | Admitting: Cardiology

## 2021-12-29 ENCOUNTER — Other Ambulatory Visit (HOSPITAL_COMMUNITY): Payer: Self-pay

## 2021-12-29 DIAGNOSIS — E1165 Type 2 diabetes mellitus with hyperglycemia: Secondary | ICD-10-CM | POA: Diagnosis not present

## 2021-12-29 DIAGNOSIS — I729 Aneurysm of unspecified site: Secondary | ICD-10-CM

## 2021-12-29 DIAGNOSIS — I10 Essential (primary) hypertension: Secondary | ICD-10-CM | POA: Diagnosis not present

## 2021-12-29 DIAGNOSIS — I25118 Atherosclerotic heart disease of native coronary artery with other forms of angina pectoris: Secondary | ICD-10-CM | POA: Diagnosis not present

## 2021-12-29 DIAGNOSIS — I97638 Postprocedural hematoma of a circulatory system organ or structure following other circulatory system procedure: Secondary | ICD-10-CM

## 2021-12-29 DIAGNOSIS — R0609 Other forms of dyspnea: Secondary | ICD-10-CM | POA: Diagnosis not present

## 2021-12-29 LAB — GLUCOSE, CAPILLARY
Glucose-Capillary: 216 mg/dL — ABNORMAL HIGH (ref 70–99)
Glucose-Capillary: 172 mg/dL — ABNORMAL HIGH (ref 70–99)

## 2021-12-29 LAB — CBC
HCT: 39.2 % (ref 39.0–52.0)
Hemoglobin: 12.7 g/dL — ABNORMAL LOW (ref 13.0–17.0)
MCH: 29.6 pg (ref 26.0–34.0)
MCHC: 32.4 g/dL (ref 30.0–36.0)
MCV: 91.4 fL (ref 80.0–100.0)
Platelets: 198 10*3/uL (ref 150–400)
RBC: 4.29 MIL/uL (ref 4.22–5.81)
RDW: 13.7 % (ref 11.5–15.5)
WBC: 6.9 10*3/uL (ref 4.0–10.5)
nRBC: 0 % (ref 0.0–0.2)

## 2021-12-29 LAB — POCT ACTIVATED CLOTTING TIME
Activated Clotting Time: 257 seconds
Activated Clotting Time: 570 seconds
Activated Clotting Time: 131 seconds

## 2021-12-29 NOTE — Progress Notes (Signed)
? ?  VASCULAR SURGERY ASSESSMENT & PLAN:  ? ?RIGHT GROIN HEMATOMA: The swelling in his right groin is stable.  He does have some tenderness so I have ordered a duplex to rule out a pseudoaneurysm.  Okay to mobilize from my standpoint. ? ? ?SUBJECTIVE:  ? ?Some mild tenderness in the right groin. ? ?PHYSICAL EXAM:  ? ?Vitals:  ? 12/28/21 1712 12/28/21 2031 12/29/21 0034 12/29/21 0541  ?BP: (!) 148/76 135/82 116/77 139/71  ?Pulse: 86 83 76 88  ?Resp: 16 18 16 18   ?Temp: 98.2 ?F (36.8 ?C) 98.3 ?F (36.8 ?C) 98.3 ?F (36.8 ?C) 97.9 ?F (36.6 ?C)  ?TempSrc: Oral Oral Oral Oral  ?SpO2: 100% 99% 98% 98%  ?Weight:      ?Height:      ? ?The hematoma in the right groin is stable.  He has significant ecchymosis. ?There is no hematoma on the left. ? ?LABS:  ? ?Lab Results  ?Component Value Date  ? WBC 7.5 12/28/2021  ? HGB 12.2 (L) 12/28/2021  ? HCT 35.8 (L) 12/28/2021  ? MCV 89.3 12/28/2021  ? PLT 218 12/28/2021  ? ?Lab Results  ?Component Value Date  ? CREATININE 1.22 12/28/2021  ? ?No results found for: INR, PROTIME ?CBG (last 3)  ?Recent Labs  ?  12/28/21 ?1415 12/28/21 ?1754 12/28/21 ?2125  ?GLUCAP 138* 261* 121*  ? ? ?PROBLEM LIST:   ? ?Principal Problem: ?  Post PTCA ?Active Problems: ?  CAD (coronary artery disease) ? ? ?CURRENT MEDS:  ? ? amLODipine  10 mg Oral Daily  ? aspirin EC  81 mg Oral Daily  ? clopidogrel  75 mg Oral Q breakfast  ? ezetimibe  10 mg Oral Daily  ? insulin aspart  0-20 Units Subcutaneous TID WC  ? insulin aspart  0-5 Units Subcutaneous QHS  ? insulin aspart  6 Units Subcutaneous TID WC  ? pantoprazole  40 mg Oral Daily  ? pravastatin  40 mg Oral QPM  ? sodium chloride flush  3 mL Intravenous Q12H  ? ? ?2126 ?Office: [phone] ?12/29/2021 ? ?

## 2021-12-29 NOTE — Discharge Summary (Signed)
Physician Discharge Summary  ?Patient ID: ?Gary Jimenez ?MRN: [id number] ?DOB/AGE: 66-Nov-1957 66 y.o. ? ?Admit date: 12/28/2021 ?Discharge date: 12/29/2021 ? ?Primary Discharge Diagnosis: ?Coronary artery disease ? ?Secondary Discharge Diagnosis: ?Hypertension ?Type 2 DM ? ? ?Hospital Course:  ? ?66 y/o Caucasian male with hypertension, hyperlipidemia, uncontrolled type 2 DM, single vessel CAD (mid LAD) with lifestyle limiting exertional dyspnea on medical therapy. ? ?Patient underwent successful PCI to LAD. He developed hematoma in right groin, which was improved after heparin reversal and manual pressure. Vascular US showed no pseudoaneurysm.  He noticed improvement in his dyspnea on walking with cardiac rehab. He was discharged on Aspirin, plavix. ? ? ?Discharge Exam: ?Blood pressure 137/76, pulse 88, temperature 97.9 ?F (36.6 ?C), temperature source Oral, resp. rate 18, height $RemoveBe'5\' 5"'xVuhjxsxD$  (1.651 m), weight 113.4 kg, SpO2 98 %.  ? ?Physical Exam ?Vitals and nursing note reviewed.  ?Constitutional:   ?   General: He is not in acute distress. ?Neck:  ?   Vascular: No JVD.  ?Cardiovascular:  ?   Rate and Rhythm: Normal rate and regular rhythm.  ?   Heart sounds: Normal heart sounds. No murmur heard. ?Pulmonary:  ?   Effort: Pulmonary effort is normal.  ?   Breath sounds: Normal breath sounds. No wheezing or rales.  ?Musculoskeletal:  ?   Right lower leg: No edema.  ?   Left lower leg: No edema.  ?Skin: ?   Comments: Right and left groin ecchymosis, mild right groin hematoma  ? ? ? ? ?Significant Diagnostic Studies: ? ?EKG 12/29/2021: ?Sinus rhythm ?Normal ECG ? ?Coronary intervention 12/28/2021: ?LM: Normal ?LAD: Mid 80% stenosis. Adjacent diag with 40% prox disease ?Lcx: No significant disease ?  ? Successful IVUS guided percutaneous coronary intervention mid LAD ?       PTCA and stent placement 2.75 X 15 mm Onyx Frontier drug-eluting stent ?       Post dilatation using 3.25X8 mm [place] balloon at 18 atm ?  ?Right groin dye  extravasation and hematoma that developed at the end of the case nearly resolved before patient left the cath lab. ?  ? ? ?FOLLOW UP PLANS AND APPOINTMENTS ?Discharge Instructions   ? ? Amb Referral to Cardiac Rehabilitation   Complete by: As directed ?  ? To [place]  ? Diagnosis:  Coronary Stents ?PTCA  ?  ? After initial evaluation and assessments completed: Virtual Based Care may be provided alone or in conjunction with Phase 2 Cardiac Rehab based on patient barriers.: Yes  ? Amb Referral to Cardiac Rehabilitation   Complete by: As directed ?  ? Diagnosis:  PTCA ?Coronary Stents  ?  ? After initial evaluation and assessments completed: Virtual Based Care may be provided alone or in conjunction with Phase 2 Cardiac Rehab based on patient barriers.: Yes  ? Diet - low sodium heart healthy   Complete by: As directed ?  ? Increase activity slowly   Complete by: As directed ?  ? ?  ? ?Allergies as of 12/29/2021   ? ?   Reactions  ? Cephalexin Other (See Comments)  ? Drug induced colitis   ? Dulaglutide Other (See Comments)  ? GI Upset (intolerance) ?Trulicity  ? Liraglutide Other (See Comments)  ?  GI Upset (intolerance) ?Victoza  ? ?  ? ?  ?Medication List  ?  ? ?STOP taking these medications   ? ?omeprazole 40 MG capsule ?Commonly known as: PRILOSEC ?  ? ?  ? ?TAKE these  medications   ? ?amLODipine 10 MG tablet ?Commonly known as: NORVASC ?Take 10 mg by mouth daily. ?  ?aspirin EC 81 MG tablet ?Take 1 tablet (81 mg total) by mouth daily. ?  ?clopidogrel 75 MG tablet ?Commonly known as: PLAVIX ?Take 1 tablet (75 mg total) by mouth daily with breakfast. ?  ?ezetimibe 10 MG tablet ?Commonly known as: ZETIA ?Take 10 mg by mouth daily. ?  ?fluticasone 50 MCG/ACT nasal spray ?Commonly known as: FLONASE ?Place 1 spray into both nostrils daily as needed for allergies or rhinitis (Congestion and ear congestion). ?  ?glipiZIDE 5 MG tablet ?Commonly known as: GLUCOTROL ?Take 5 mg by mouth 2 (two) times daily before a meal. ?   ?hydrochlorothiazide 25 MG tablet ?Commonly known as: HYDRODIURIL ?Take 25 mg by mouth daily. ?  ?isosorbide mononitrate 30 MG 24 hr tablet ?Commonly known as: IMDUR ?Take 1 tablet (30 mg total) by mouth daily. ?  ?metFORMIN 500 MG 24 hr tablet ?Commonly known as: GLUCOPHAGE-XR ?Take 1,000 mg by mouth 2 (two) times daily. ?  ?olmesartan 40 MG tablet ?Commonly known as: BENICAR ?Take 40 mg by mouth daily. ?  ?pravastatin 40 MG tablet ?Commonly known as: PRAVACHOL ?Take 1 tablet (40 mg total) by mouth every evening. ?  ?VITAMIN B-12 PO ?Take 2,500 mcg by mouth daily. ?  ?Vitamin D3 25 MCG tablet ?Commonly known as: Vitamin D ?Take 1,000 Units by mouth daily. ?  ? ?  ? ? ? ? ? ?Nigel Mormon, MD ?Pager: [phone] ?Office: [phone] ? ? ?

## 2021-12-29 NOTE — Progress Notes (Signed)
Right groin pseudoaneurysm duplex has been completed. ?Preliminary results can be found in CV Proc through chart review.  ? ?12/29/21 10:19 AM ?Olen Cordial RVT   ?

## 2021-12-29 NOTE — Progress Notes (Signed)
CARDIAC REHAB PHASE I  ? ?PRE:  Rate/Rhythm: 87 SR ? ?  BP: sitting 133/82 ? ?  SaO2: 98 RA ? ?MODE:  Ambulation: 470 ft  ? ?POST:  Rate/Rhythm: 104 ST ? ?  BP: sitting 149/80  ? ?  SaO2:  ? ?Pt able to ambulate without groin issues or SOB. He sts this is farther than he could walk PTA. Discussed with pt and wife stent, restrictions, plavix importance, diet, exercise, and CRPII. Pt receptive. Will refer to both [place] and [place] CRPII (pt deciding). ?[id number]  ? ?Ethelda Chick CES, ACSM ?12/29/2021 ?1:25 PM ? ? ? ? ?

## 2021-12-31 ENCOUNTER — Telehealth (HOSPITAL_COMMUNITY): Payer: Self-pay

## 2021-12-31 NOTE — Telephone Encounter (Signed)
Per phase I cardiac rehab, fax cardiac rehab referral to [facility]. °

## 2022-01-03 ENCOUNTER — Ambulatory Visit: Payer: HMO | Admitting: Cardiology

## 2022-01-03 ENCOUNTER — Encounter: Payer: Self-pay | Admitting: Cardiology

## 2022-01-03 ENCOUNTER — Telehealth (HOSPITAL_COMMUNITY): Payer: Self-pay

## 2022-01-03 VITALS — BP 118/74 | HR 95 | Temp 98.5°F | Resp 17 | Ht 65.0 in | Wt 258.8 lb

## 2022-01-03 DIAGNOSIS — I10 Essential (primary) hypertension: Secondary | ICD-10-CM

## 2022-01-03 DIAGNOSIS — I25118 Atherosclerotic heart disease of native coronary artery with other forms of angina pectoris: Secondary | ICD-10-CM

## 2022-01-03 DIAGNOSIS — E782 Mixed hyperlipidemia: Secondary | ICD-10-CM

## 2022-01-03 DIAGNOSIS — Z794 Long term (current) use of insulin: Secondary | ICD-10-CM

## 2022-01-03 MED ORDER — PRAVASTATIN SODIUM 40 MG PO TABS: 40.0000 mg | ORAL_TABLET | Freq: Every evening | ORAL | 3 refills | Status: DC

## 2022-01-03 MED ORDER — PANTOPRAZOLE SODIUM 40 MG PO TBEC: 40.0000 mg | DELAYED_RELEASE_TABLET | Freq: Every day | ORAL | 3 refills | Status: DC

## 2022-01-03 MED ORDER — ASPIRIN EC 81 MG PO TBEC: 81.0000 mg | DELAYED_RELEASE_TABLET | Freq: Every day | ORAL | 3 refills | Status: AC

## 2022-01-03 MED ORDER — CLOPIDOGREL BISULFATE 75 MG PO TABS: 75.0000 mg | ORAL_TABLET | Freq: Every day | ORAL | 1 refills | Status: DC

## 2022-01-03 NOTE — Telephone Encounter (Signed)
Pt wants to go to [facility] for cardiac rehab. Faxed pt referral over to [place]. ?Closed referral ?

## 2022-01-03 NOTE — Progress Notes (Signed)
? ? ?Patient self referred for dyspnea on exertion, chest pain, abnormal stress test ? ?Subjective:  ? ?Gary Jimenez, male    DOB: 12-23-1955, 66 y.o.   MRN: [id number] ? ?Chief Complaint  ?Patient presents with  ? Follow-up  ?  2 WEEKS  ? Coronary artery disease involving native coronary artery of  ? ? ?HPI ? ?66 y/o Caucasian male with hypertension, hyperlipidemia, uncontrolled type 2 DM, CAD ? ?Patient underwent successful PCI to mid LAD.single vessel CAD (mid LAD) (12/2021). ? ?He developed right groin hematoma, but was controlled with heparin reversal and manual compression. With concern for perforation, left groin acces was obtained to perform angiogram of Rt CFA. Perforation was excluded. [place] excluded pseudoaneurysm. ? ?He has had improvement in his breathing since then. Bruising remains in both groins.  ? ?Initial consultation visit 09/2021: ?Patient is here today with his wife.  Patient worked at [employer] for several years, retired in 2019.  He is to be quite active doing yard work over several acres without much difficulty, until summer 2022.  Over the last 6 months, he has had progressive worsening of dyspnea on exertion.  Now, he struggles to do minimal yard work.  He has only occasional episodes of chest pain on exertion, but this is not a common feature.  He does have controlled hypertension and hyperlipidemia.  He has had stable weight around 260 pounds until about a month ago, and he is gained 6 pounds.  He has OSA, regularly uses CPAP.  He has controlled hypertension and type 2 diabetes mellitus.  Lipids are reasonably well controlled.  He has not tolerated statins-Crestor Lipitor, in the past due to myalgias.  He currently takes Zetia. ? ?Patient was last seen by cardiologist Dr. Abran Richard at [facility] on 09/01/2021.  Patient underwent work-up for chest pain and dyspnea at their office.  Echocardiogram showed normal EF, no significant valvular abnormality.  Stress test  showed moderate size inferior wall, partially reversible defect.  Given the patient's dyspnea had improved and he has not had any significant recurrent chest discomfort, option of medical management versus invasive work-up was offered to the patient.  However, patient wanted to seek second opinion with our practice since his wife is also established patient of ours. ? ? ?Current Outpatient Medications:  ?  amLODipine (NORVASC) 10 MG tablet, Take 10 mg by mouth daily., Disp: , Rfl:  ?  aspirin EC 81 MG tablet, Take 1 tablet (81 mg total) by mouth daily., Disp: 90 tablet, Rfl: 3 ?  clopidogrel (PLAVIX) 75 MG tablet, Take 1 tablet (75 mg total) by mouth daily with breakfast., Disp: 30 tablet, Rfl: 1 ?  Cyanocobalamin (VITAMIN B-12 PO), Take 2,500 mcg by mouth daily., Disp: , Rfl:  ?  ezetimibe (ZETIA) 10 MG tablet, Take 10 mg by mouth daily., Disp: , Rfl:  ?  fluticasone (FLONASE) 50 MCG/ACT nasal spray, Place 1 spray into both nostrils daily as needed for allergies or rhinitis (Congestion and ear congestion)., Disp: , Rfl:  ?  glipiZIDE (GLUCOTROL) 5 MG tablet, Take 5 mg by mouth 2 (two) times daily before a meal., Disp: , Rfl:  ?  hydrochlorothiazide (HYDRODIURIL) 25 MG tablet, Take 25 mg by mouth daily., Disp: , Rfl:  ?  isosorbide mononitrate (IMDUR) 30 MG 24 hr tablet, Take 1 tablet (30 mg total) by mouth daily., Disp: 60 tablet, Rfl: 3 ?  metFORMIN (GLUCOPHAGE-XR) 500 MG 24 hr tablet, Take 1,000 mg by mouth  2 (two) times daily., Disp: , Rfl:  ?  olmesartan (BENICAR) 40 MG tablet, Take 40 mg by mouth daily., Disp: , Rfl:  ?  pravastatin (PRAVACHOL) 40 MG tablet, Take 1 tablet (40 mg total) by mouth every evening., Disp: 90 tablet, Rfl: 2 ?  Vitamin D3 (VITAMIN D) 25 MCG tablet, Take 1,000 Units by mouth daily., Disp: , Rfl:  ? ? ? ?Cardiovascular and other pertinent studies: ? ?EKG 01/03/2022: ?Sinus rhythm 86 bpm ?Low voltage in precordial leads ?Otherwise normal EKG ? ?Coronary intervention 12/28/2021: ?LM:  Normal ?LAD: Mid 80% stenosis. Adjacent diag with 40% prox disease ?Lcx: No significant disease ?  ? Successful IVUS guided percutaneous coronary intervention mid LAD ?       PTCA and stent placement 2.75 X 15 mm Onyx Frontier drug-eluting stent ?       Post dilatation using 3.25X8 mm [place] balloon at 18 atm ?  ?Right groin dye extravasation and hematoma that developed at the end of the case nearly resolved before patient left the cath lab. ? ?LHC/RHC 10/20/2021: ?LM: Normal ?LAD: Tortuous ?         Mid LAD 80% stenosis ?Lcx: No significant disease ?Ramus: No significant disease ?RCA: Large, tortuous. No significant disease ?  ?Normal filling pressures ?  ?Unable to wire LAD due to tortuosity and angulated takeoff. ?Aborted due to amount of radiation and contrast used. ?Recommend medical therapy for now. Added Aspirin 81 mg and metoprolol succinate 25 mg daily. ?Will re-attempt down the road, if symptoms do not improve ? ?Coronary angiography 10/05/2021: ?LM: Normal ?LAD: Tortuous ?         Mid LAD 80% stenosis ?Lcx: No significant disease ?Ramus: No significant disease ?RCA: Large, tortuous. No significant disease ?  ?Normal filling pressures ?  ?Unable to wire LAD due to tortuosity and angulated takeoff. ?Aborted due to amount of radiation and contrast used. ?Recommend medical therapy for now. Added Aspirin 81 mg and metoprolol succinate 25 mg daily. ?Will re-attempt down the road, if symptoms do not improve ? ?EKG 09/23/2021: ?Sinus rhythm 90 bpm ?Normal EKG ? ?Echocardiogram 07/27/2021: ?Left ventricular systolic function is normal.  ?LV ejection fraction = 55-60%.  ?There is aortic valve sclerosis.  ?There is no aortic stenosis.  ?There is no comparison study available.  ? ? ?Recent labs: ?10/20/2021: ?Chol 154, TG 221, HDL 45,  LDL 73 ? ?09/23/2021: ?Glucose 237, BUN/Cr 25/1.36. EGFR 58. Na/K 144/4.7.  ?H/H 14/45. MCV 90. Platelets 246 ? ?06/01/2021: ?Glucose 140.  BUN/creatinine 36/1.56.  eGFR 49.  NA/K 138/4.7.  ALT  53.  Rest of the CMP normal. ?H/H 14/42.  MCV 89.  Platelets 245. ?HbA1c 7.0% ?TSH 0.84 normal ? ?12/2020: ?Chol 167, TG 192, HDL 43,  LDL 104 ? ? ?Review of Systems  ?Constitutional: Positive for malaise/fatigue.  ?Cardiovascular:  Positive for dyspnea on exertion. Negative for chest pain, leg swelling, palpitations and syncope.  ?Musculoskeletal:  Positive for joint pain.  ? ?   ? ? ?Vitals:  ? 01/03/22 1334  ?BP: 118/74  ?Pulse: 95  ?Resp: 17  ?Temp: 98.5 ?F (36.9 ?C)  ?SpO2: 95%  ? ? ? ?Body mass index is 43.07 kg/m?. Danley Danker Weights  ? 01/03/22 1334  ?Weight: 258 lb 12.8 oz (117.4 kg)  ? ? ? ?Objective:  ? Physical Exam ?Vitals and nursing note reviewed.  ?Constitutional:   ?   General: He is not in acute distress. ?   Appearance: He is obese.  ?Neck:  ?  Vascular: No JVD.  ?Cardiovascular:  ?   Rate and Rhythm: Normal rate and regular rhythm.  ?   Pulses: Normal pulses.  ?   Heart sounds: Normal heart sounds. No murmur heard. ?Pulmonary:  ?   Effort: Pulmonary effort is normal.  ?   Breath sounds: Normal breath sounds. No wheezing or rales.  ?Musculoskeletal:  ?   Right lower leg: No edema.  ?   Left lower leg: No edema.  ? ? ?   ?  ICD-10-CM   ?1. Coronary artery disease involving native coronary artery of native heart with other form of angina pectoris ([place])  I25.118 Lipid panel  ?  ?2. Essential hypertension  I10 EKG 12-Lead  ?  ?3. Mixed hyperlipidemia  E78.2 Lipid panel  ?  ?4. Type 2 diabetes mellitus without complication, with long-term current use of insulin (HCC)  E11.9 pravastatin (PRAVACHOL) 40 MG tablet  ? Z79.4   ?  ? ? ? ?Assessment & Recommendations:  ? ? ?66 year old Caucasian male with hypertension, hyperlipidemia, type 2 diabetes mellitus, CAD  ? ?CAD: ?Improvement in exertional dyspnea after mid LAD PCI. ?Recommend Aspirin/plavix at least for 6 months. ?Okay to stop Imdur. ?Has had myalgias with most statins. Continue pravastatin 40 mg and Zetia 10 mg daily. ?Recommend pantoprazole in place  of omeprazole given ongoing use of plavix.  ? ?Mixed hyperlipidemia: ?As above. ? ?Hypertension: ?Well-controlled ? ?Type 2 diabetes mellitus: ?Recently uncontrolled. Had f/u w/PCP w/recent improvement in b

## 2022-01-06 ENCOUNTER — Telehealth (HOSPITAL_COMMUNITY): Payer: Self-pay

## 2022-01-06 ENCOUNTER — Other Ambulatory Visit (HOSPITAL_COMMUNITY): Payer: Self-pay

## 2022-01-06 NOTE — Telephone Encounter (Signed)
Pharmacy Transitions of Care Follow-up Telephone Call ? ?Date of discharge: 12/29/2021  ?Discharge Diagnosis: Post PTCA ? ?How have you been since you were released from the hospital? Patient reports doing well since discharge. ? ?Medication changes made at discharge: ? - START: Plavix  ? - STOPPED: omeprazole ? ? ?Medication changes verified by the patient? Yes ?  ? ?Medication Accessibility: ? ?Home Pharmacy: [facility] [address] ? ?Was the patient provided with refills on discharged medications? yes  ? ?Have all prescriptions been transferred from [facility] to home pharmacy? Yes  ? ?Is the patient able to afford medications? Yes ?Notable copays: $0 for Plavix  ?  ? ?Medication Review: ? ?CLOPIDOGREL (PLAVIX) ?Clopidogrel 75 mg once daily.  ?- Educated patient on expected duration of therapy of at least 6 months with clopidogrel and ASA.  ?- Advised patient of medications to avoid (NSAIDs)  ?- Educated that Tylenol (acetaminophen) will be the preferred analgesic to prevent risk of bleeding  ?- Emphasized importance of monitoring for signs and symptoms of bleeding (abnormal bruising, prolonged bleeding, nose bleeds, bleeding from gums, discolored urine, black tarry stools)  ?- Advised patient to alert all providers of anticoagulation therapy prior to starting a new medication or having a procedure  ? ? ? ?Follow-up Appointments: ?[facility]?  Scheduled to see Truett Mainland on 05/09/2022 @ 1:15PM.  ? ?If their condition worsens, is the pt aware to call PCP or go to the Emergency Dept.? Yes ? ?Final Patient Assessment: ?-Pt is doing well.  ?-Pt verbalized understanding of DAPT therapy with clopidogrel and ASA.  ?-Pt has post discharge appointment and refills are available at [facility] on [facility]. ? ? ?

## 2022-01-14 ENCOUNTER — Ambulatory Visit: Payer: HMO | Admitting: Cardiology

## 2022-03-25 ENCOUNTER — Other Ambulatory Visit: Payer: Self-pay | Admitting: Cardiology

## 2022-05-03 ENCOUNTER — Other Ambulatory Visit (HOSPITAL_COMMUNITY): Payer: Self-pay | Admitting: Cardiology

## 2022-05-04 LAB — LIPID PANEL W/O CHOL/HDL RATIO
Cholesterol, Total: 164 mg/dL (ref 100–199)
HDL: 45 mg/dL (ref >39)
LDL Chol Calc (NIH): 87 mg/dL (ref 0–99)
Triglycerides: 186 mg/dL — ABNORMAL HIGH (ref 0–149)
VLDL Cholesterol Cal: 32 mg/dL (ref 5–40)

## 2022-05-04 LAB — SPECIMEN STATUS REPORT

## 2022-05-06 ENCOUNTER — Other Ambulatory Visit: Payer: Self-pay | Admitting: Cardiology

## 2022-05-06 DIAGNOSIS — I25118 Atherosclerotic heart disease of native coronary artery with other forms of angina pectoris: Secondary | ICD-10-CM

## 2022-05-09 ENCOUNTER — Encounter: Payer: Self-pay | Admitting: Cardiology

## 2022-05-09 ENCOUNTER — Ambulatory Visit: Payer: HMO | Admitting: Cardiology

## 2022-05-09 VITALS — BP 124/78 | HR 81 | Temp 97.9°F | Resp 16 | Ht 65.0 in | Wt 263.2 lb

## 2022-05-09 DIAGNOSIS — E782 Mixed hyperlipidemia: Secondary | ICD-10-CM

## 2022-05-09 DIAGNOSIS — G720 Drug-induced myopathy: Secondary | ICD-10-CM | POA: Insufficient documentation

## 2022-05-09 DIAGNOSIS — I10 Essential (primary) hypertension: Secondary | ICD-10-CM

## 2022-05-09 DIAGNOSIS — I25118 Atherosclerotic heart disease of native coronary artery with other forms of angina pectoris: Secondary | ICD-10-CM

## 2022-05-09 DIAGNOSIS — T466X5A Adverse effect of antihyperlipidemic and antiarteriosclerotic drugs, initial encounter: Secondary | ICD-10-CM | POA: Insufficient documentation

## 2022-05-09 MED ORDER — REPATHA SURECLICK 140 MG/ML ~~LOC~~ SOAJ: 140.0000 mg | SUBCUTANEOUS | 5 refills | Status: DC

## 2022-05-09 NOTE — Progress Notes (Signed)
Patient self referred for dyspnea on exertion, chest pain, abnormal stress test  Subjective:   Gary Jimenez, male    DOB: Jun 25, 1956, 66 y.o.   MRN: [id number]  Chief Complaint  Patient presents with   Coronary Artery Disease    HPI  66 y/o Caucasian male with hypertension, hyperlipidemia, uncontrolled type 2 DM, CAD  Patient underwent successful PCI to mid LAD.single vessel CAD (mid LAD) (12/2021).  He is doing very well, denies chest pain, shortness of breath, palpitations, leg edema, orthopnea, PND, TIA/syncope. Reviewed recent test results with the patient, details below.    Initial consultation visit 09/2021: Patient is here today with his wife.  Patient worked at [employer] for several years, retired in 2019.  He is to be quite active doing yard work over several acres without much difficulty, until summer 2022.  Over the last 6 months, he has had progressive worsening of dyspnea on exertion.  Now, he struggles to do minimal yard work.  He has only occasional episodes of chest pain on exertion, but this is not a common feature.  He does have controlled hypertension and hyperlipidemia.  He has had stable weight around 260 pounds until about a month ago, and he is gained 6 pounds.  He has OSA, regularly uses CPAP.  He has controlled hypertension and type 2 diabetes mellitus.  Lipids are reasonably well controlled.  He has not tolerated statins-Crestor Lipitor, in the past due to myalgias.  He currently takes Zetia.  Patient was last seen by cardiologist Dr. Abran Richard at [facility] on 09/01/2021.  Patient underwent work-up for chest pain and dyspnea at their office.  Echocardiogram showed normal EF, no significant valvular abnormality.  Stress test showed moderate size inferior wall, partially reversible defect.  Given the patient's dyspnea had improved and he has not had any significant recurrent chest discomfort, option of medical management versus  invasive work-up was offered to the patient.  However, patient wanted to seek second opinion with our practice since his wife is also established patient of ours.   Current Outpatient Medications:    amLODipine (NORVASC) 10 MG tablet, Take 10 mg by mouth daily., Disp: , Rfl:    aspirin EC 81 MG tablet, Take 1 tablet (81 mg total) by mouth daily., Disp: 90 tablet, Rfl: 3   clopidogrel (PLAVIX) 75 MG tablet, TAKE ONE TABLET BY MOUTH DAILY WITH BREAKFAST, Disp: 30 tablet, Rfl: 1   Cyanocobalamin (VITAMIN B-12 PO), Take 2,500 mcg by mouth daily., Disp: , Rfl:    ezetimibe (ZETIA) 10 MG tablet, Take 10 mg by mouth daily., Disp: , Rfl:    fluticasone (FLONASE) 50 MCG/ACT nasal spray, Place 1 spray into both nostrils daily as needed for allergies or rhinitis (Congestion and ear congestion)., Disp: , Rfl:    glipiZIDE (GLUCOTROL) 5 MG tablet, Take 5 mg by mouth 2 (two) times daily before a meal., Disp: , Rfl:    hydrochlorothiazide (HYDRODIURIL) 25 MG tablet, Take 25 mg by mouth daily., Disp: , Rfl:    metFORMIN (GLUCOPHAGE-XR) 500 MG 24 hr tablet, Take 2,000 mg by mouth 2 (two) times daily. 2 tab in the morning, 2 at night, Disp: , Rfl:    olmesartan (BENICAR) 40 MG tablet, Take 40 mg by mouth daily., Disp: , Rfl:    pantoprazole (PROTONIX) 40 MG tablet, Take 1 tablet (40 mg total) by mouth daily., Disp: 90 tablet, Rfl: 3   pravastatin (PRAVACHOL) 40 MG tablet, Take 1  tablet (40 mg total) by mouth every evening., Disp: 90 tablet, Rfl: 3   Vitamin D3 (VITAMIN D) 25 MCG tablet, Take 1,000 Units by mouth daily., Disp: , Rfl:     Cardiovascular and other pertinent studies:  EKG 01/03/2022: Sinus rhythm 86 bpm Low voltage in precordial leads Otherwise normal EKG  Coronary intervention 12/28/2021: LM: Normal LAD: Mid 80% stenosis. Adjacent diag with 40% prox disease Lcx: No significant disease    Successful IVUS guided percutaneous coronary intervention mid LAD        PTCA and stent placement 2.75 X  15 mm Onyx Frontier drug-eluting stent        Post dilatation using 3.25X8 mm [place] balloon at 18 atm   Echocardiogram 07/27/2021: Left ventricular systolic function is normal.  LV ejection fraction = 55-60%.  There is aortic valve sclerosis.  There is no aortic stenosis.  There is no comparison study available.    Recent labs: 05/03/2022: Chol 164, TG 186, HDL 45,  LDL 87  10/20/2021: Chol 154, TG 221, HDL 45,  LDL 73  09/23/2021: Glucose 237, BUN/Cr 25/1.36. EGFR 58. Na/K 144/4.7.  H/H 14/45. MCV 90. Platelets 246    Review of Systems  Constitutional: Positive for malaise/fatigue.  Cardiovascular:  Positive for dyspnea on exertion. Negative for chest pain, leg swelling, palpitations and syncope.  Musculoskeletal:  Positive for joint pain.         Vitals:   05/09/22 1317  BP: 124/78  Pulse: 81  Resp: 16  Temp: 97.9 F (36.6 C)  SpO2: 97%     Body mass index is 43.8 kg/m. Filed Weights   05/09/22 1317  Weight: 263 lb 3.2 oz (119.4 kg)     Objective:   Physical Exam Vitals and nursing note reviewed.  Constitutional:      General: He is not in acute distress.    Appearance: He is obese.  Neck:     Vascular: No JVD.  Cardiovascular:     Rate and Rhythm: Normal rate and regular rhythm.     Pulses: Normal pulses.     Heart sounds: Normal heart sounds. No murmur heard. Pulmonary:     Effort: Pulmonary effort is normal.     Breath sounds: Normal breath sounds. No wheezing or rales.  Musculoskeletal:     Right lower leg: No edema.     Left lower leg: No edema.          ICD-10-CM   1. Coronary artery disease involving native coronary artery of native heart with other form of angina pectoris (HCC)  I25.118 Evolocumab (REPATHA SURECLICK) 903 MG/ML SOAJ    Lipid panel    Lipid panel    2. Essential hypertension  I10     3. Mixed hyperlipidemia  E78.2 Evolocumab (REPATHA SURECLICK) 009 MG/ML SOAJ    Lipid panel    Lipid panel       Assessment &  Recommendations:    66 year old Caucasian male with hypertension, hyperlipidemia, type 2 diabetes mellitus, CAD   CAD: Improvement in exertional dyspnea after mid LAD PCI. Okay to stop plavix next month. Continue Aspirin 81 mg daily. Has had myalgias with most statins.  LDL 87 on Continue pravastatin 40 mg and Zetia 10 mg daily. Will stop Zetia and start Repatha instead.  Repeat lipid panel in 3 months  Mixed hyperlipidemia: As above.  Hypertension: Well-controlled  Type 2 diabetes mellitus: Recently uncontrolled. Had f/u w/PCP w/recent improvement in blood sugar.  F/u in 10/2022  Nigel Mormon, MD Pager: [phone] Office: [phone]

## 2022-05-31 ENCOUNTER — Telehealth: Payer: Self-pay | Admitting: Cardiology

## 2022-05-31 NOTE — Telephone Encounter (Signed)
Patient says he needed a PA for one of his medications, still waiting on the status. He's asking if he needs to check with his insurance company or if we have an update.

## 2022-06-02 ENCOUNTER — Other Ambulatory Visit: Payer: Self-pay | Admitting: Cardiology

## 2022-06-03 NOTE — Telephone Encounter (Signed)
Aware patient needs PA will work on it and keep patient inform

## 2022-06-14 ENCOUNTER — Encounter: Payer: Self-pay | Admitting: Podiatry

## 2022-06-21 ENCOUNTER — Ambulatory Visit: Payer: HMO | Admitting: Podiatry

## 2022-06-23 ENCOUNTER — Ambulatory Visit: Payer: HMO | Admitting: Podiatry

## 2022-06-23 DIAGNOSIS — L603 Nail dystrophy: Secondary | ICD-10-CM | POA: Diagnosis not present

## 2022-06-25 NOTE — Progress Notes (Signed)
Subjective:  Patient ID: Gary Jimenez, male    DOB: [birth year],  MRN: [id number] HPI Chief Complaint  Patient presents with   Nail Problem    diabetic with bil toenail fungus.     66 y.o. male presents with the above complaint.   ROS: Denies fever chills nausea vomiting muscle aches pains calf pain back pain chest pain shortness of breath.  Past Medical History:  Diagnosis Date   Diabetes mellitus without complication (HCC)    Hyperlipidemia    Hypertension    Past Surgical History:  Procedure Laterality Date   CORONARY STENT INTERVENTION N/A 12/28/2021   Procedure: CORONARY STENT INTERVENTION;  Surgeon: Elder Negus, MD;  Location: [facility] INVASIVE CV [facility];  Service: Cardiovascular;  Laterality: N/A;   INTRAVASCULAR ULTRASOUND/IVUS N/A 12/28/2021   Procedure: Intravascular Ultrasound/IVUS;  Surgeon: Elder Negus, MD;  Location: [facility] INVASIVE CV LAB;  Service: Cardiovascular;  Laterality: N/A;   LEFT HEART CATH AND CORONARY ANGIOGRAPHY N/A 12/28/2021   Procedure: LEFT HEART CATH AND CORONARY ANGIOGRAPHY;  Surgeon: Elder Negus, MD;  Location: [facility] INVAS[facility] CV LAB;  Service: Cardiovascular;  Laterality: N/A;   RIGHT/LEFT HEART CATH AND CORONARY ANGIOGRAPHY N/A 10/05/2021   Procedure: RIGHT/LEFT HEART CATH AND CORONARY ANGIOGRAPHY;  Surgeon: Elder Negus, MD;  Location: [facility] INVASIVE CV [facility];  Service: Cardiovascular;  Laterality: N/A;    Current Outpatient Medications:    amLODipine (NORVASC) 10 MG tablet, Take 10 mg by mouth daily., Disp: , Rfl:    aspirin EC 81 MG tablet, Take 1 tablet (81 mg total) by mouth daily., Disp: 90 tablet, Rfl: 3   clopidogrel (PLAVIX) 75 MG tablet, TAKE ONE TABLET BY MOUTH DAILY WITH BREAKFAST, Disp: 30 tablet, Rfl: 0   Cyanocobalamin (VITAMIN B-12 PO), Take 2,500 mcg by mouth daily., Disp: , Rfl:    Evolocumab (REPATHA SURECLICK) 140 MG/ML SOAJ, Inject 140 mg into the skin every 14 (fourteen) days., Disp: 6 mL, Rfl: 5   fluticasone  (FLONASE) 50 MCG/ACT nasal spray, Place 1 spray into both nostrils daily as needed for allergies or rhinitis (Congestion and ear congestion)., Disp: , Rfl:    glipiZIDE (GLUCOTROL) 5 MG tablet, Take 5 mg by mouth 2 (two) times daily before a meal., Disp: , Rfl:    hydrochlorothiazide (HYDRODIURIL) 25 MG tablet, Take 25 mg by mouth daily., Disp: , Rfl:    metFORMIN (GLUCOPHAGE-XR) 500 MG 24 hr tablet, Take 2,000 mg by mouth 2 (two) times daily. 2 tab in the morning, 2 at night, Disp: , Rfl:    olmesartan (BENICAR) 40 MG tablet, Take 40 mg by mouth daily., Disp: , Rfl:    pantoprazole (PROTONIX) 40 MG tablet, Take 1 tablet (40 mg total) by mouth daily., Disp: 90 tablet, Rfl: 3   pravastatin (PRAVACHOL) 40 MG tablet, Take 1 tablet (40 mg total) by mouth every evening., Disp: 90 tablet, Rfl: 3   Vitamin D3 (VITAMIN D) 25 MCG tablet, Take 1,000 Units by mouth daily., Disp: , Rfl:   Allergies  Allergen Reactions   Cephalexin Other (See Comments)    Drug induced colitis    Dulaglutide Other (See Comments)    GI Upset (intolerance) Trulicity   Liraglutide Other (See Comments)     GI Upset (intolerance) Victoza   Review of Systems Objective:  There were no vitals filed for this visit.  General: Well developed, nourished, in no acute distress, alert and oriented x3   Dermatological: Skin is warm, dry and supple bilateral. Nails  x 10 are well maintained; remaining integument appears unremarkable at this time. There are no open sores, no preulcerative lesions, no rash or signs of infection present.  Hallux and third toe of the left foot demonstrate a thickening with subungual debris and mild discoloration consistent with nail dystrophy cannot rule out onychomycosis.  Vascular: Dorsalis Pedis artery and Posterior Tibial artery pedal pulses are 2/4 bilateral with immedate capillary fill time. Pedal hair growth present. No varicosities and no lower extremity edema present bilateral.   Neruologic:  Grossly intact via light touch bilateral. Vibratory intact via tuning fork bilateral. Protective threshold with Semmes Wienstein monofilament intact to all pedal sites bilateral. Patellar and Achilles deep tendon reflexes 2+ bilateral. No Babinski or clonus noted bilateral.   Musculoskeletal: No gross boney pedal deformities bilateral. No pain, crepitus, or limitation noted with foot and ankle range of motion bilateral. Muscular strength 5/5 in all groups tested bilateral.  Gait: Unassisted, Nonantalgic.    Radiographs:  None taken  Assessment & Plan:   Assessment: Possible nail dystrophy  Plan: Discussed etiology pathology conservative versus surgical therapy samples of the skin and nail were taken today for pathologic evaluation we will send these to the lab I will follow-up with him in 1 month.     Kamyia Thomason T. [place], [place]

## 2022-07-12 ENCOUNTER — Telehealth: Payer: Self-pay | Admitting: *Deleted

## 2022-07-12 NOTE — Telephone Encounter (Signed)
Called patient to give the negative results of nail culture per physician,verbalized understanding.

## 2022-07-19 ENCOUNTER — Ambulatory Visit: Payer: HMO | Admitting: Podiatry

## 2022-08-18 ENCOUNTER — Telehealth: Payer: Self-pay

## 2022-08-18 NOTE — Telephone Encounter (Signed)
Error

## 2022-11-09 ENCOUNTER — Ambulatory Visit: Payer: PPO | Admitting: Cardiology

## 2022-11-09 ENCOUNTER — Encounter: Payer: Self-pay | Admitting: Cardiology

## 2022-11-09 VITALS — BP 134/77 | HR 85 | Resp 19 | Ht 65.0 in | Wt 265.2 lb

## 2022-11-09 DIAGNOSIS — E119 Type 2 diabetes mellitus without complications: Secondary | ICD-10-CM

## 2022-11-09 DIAGNOSIS — I10 Essential (primary) hypertension: Secondary | ICD-10-CM

## 2022-11-09 DIAGNOSIS — E782 Mixed hyperlipidemia: Secondary | ICD-10-CM

## 2022-11-09 DIAGNOSIS — I25118 Atherosclerotic heart disease of native coronary artery with other forms of angina pectoris: Secondary | ICD-10-CM

## 2022-11-09 MED ORDER — SEMAGLUTIDE(0.25 OR 0.5MG/DOS) 2 MG/3ML ~~LOC~~ SOPN: 0.2500 mg | PEN_INJECTOR | SUBCUTANEOUS | 1 refills | Status: DC

## 2022-11-09 NOTE — Progress Notes (Signed)
Patient self referred for dyspnea on exertion, chest pain, abnormal stress test  Subjective:   Gary Jimenez, male    DOB: 1955/12/28, 67 y.o.   MRN: [id number]  Chief Complaint  Patient presents with   Coronary Artery Disease   Hyperlipidemia   Hypertension   Follow-up    6 month    HPI  67 y/o Caucasian male with hypertension, hyperlipidemia, uncontrolled type 2 DM, CAD  Patient underwent successful PCI to mid LAD.single vessel CAD (mid LAD) (12/2021).He is doing very well, denies chest pain, shortness of breath, palpitations, leg edema, orthopnea, PND, TIA/syncope. Reviewed recent test results with the patient, details below. He continues to be obese and wants to lose weight.    Initial consultation visit 09/2021: Patient is here today with his wife.  Patient worked at [employer] for several years, retired in 2019.  He is to be quite active doing yard work over several acres without much difficulty, until summer 2022.  Over the last 6 months, he has had progressive worsening of dyspnea on exertion.  Now, he struggles to do minimal yard work.  He has only occasional episodes of chest pain on exertion, but this is not a common feature.  He does have controlled hypertension and hyperlipidemia.  He has had stable weight around 260 pounds until about a month ago, and he is gained 6 pounds.  He has OSA, regularly uses CPAP.  He has controlled hypertension and type 2 diabetes mellitus.  Lipids are reasonably well controlled.  He has not tolerated statins-Crestor Lipitor, in the past due to myalgias.  He currently takes Zetia.  Patient was last seen by cardiologist Dr. Abran Richard at [facility] on 09/01/2021.  Patient underwent work-up for chest pain and dyspnea at their office.  Echocardiogram showed normal EF, no significant valvular abnormality.  Stress test showed moderate size inferior wall, partially reversible defect.  Given the patient's dyspnea had improved  and he has not had any significant recurrent chest discomfort, option of medical management versus invasive work-up was offered to the patient.  However, patient wanted to seek second opinion with our practice since his wife is also established patient of ours.   Current Outpatient Medications:    amLODipine (NORVASC) 10 MG tablet, Take 10 mg by mouth daily., Disp: , Rfl:    aspirin EC 81 MG tablet, Take 1 tablet (81 mg total) by mouth daily., Disp: 90 tablet, Rfl: 3   clopidogrel (PLAVIX) 75 MG tablet, TAKE ONE TABLET BY MOUTH DAILY WITH BREAKFAST, Disp: 30 tablet, Rfl: 0   Cyanocobalamin (VITAMIN B-12 PO), Take 2,500 mcg by mouth daily., Disp: , Rfl:    Evolocumab (REPATHA SURECLICK) 580 MG/ML SOAJ, Inject 140 mg into the skin every 14 (fourteen) days., Disp: 6 mL, Rfl: 5   ezetimibe (ZETIA) 10 MG tablet, Take 10 mg by mouth daily., Disp: , Rfl:    fluticasone (FLONASE) 50 MCG/ACT nasal spray, Place 1 spray into both nostrils daily as needed for allergies or rhinitis (Congestion and ear congestion)., Disp: , Rfl:    glipiZIDE (GLUCOTROL) 5 MG tablet, Take 5 mg by mouth 2 (two) times daily before a meal., Disp: , Rfl:    hydrochlorothiazide (HYDRODIURIL) 25 MG tablet, Take 25 mg by mouth daily., Disp: , Rfl:    metFORMIN (GLUCOPHAGE-XR) 500 MG 24 hr tablet, Take 2,000 mg by mouth 2 (two) times daily. 2 tab in the morning, 2 at night, Disp: , Rfl:  olmesartan (BENICAR) 40 MG tablet, Take 40 mg by mouth daily., Disp: , Rfl:    pantoprazole (PROTONIX) 40 MG tablet, Take 1 tablet (40 mg total) by mouth daily., Disp: 90 tablet, Rfl: 3   pravastatin (PRAVACHOL) 40 MG tablet, Take 1 tablet (40 mg total) by mouth every evening., Disp: 90 tablet, Rfl: 3   Vitamin D3 (VITAMIN D) 25 MCG tablet, Take 1,000 Units by mouth daily., Disp: , Rfl:     Cardiovascular and other pertinent studies:  EKG 11/09/2022: Sinus rhythm 83 bpm Occasional PAC    Coronary intervention 12/28/2021: LM: Normal LAD: Mid 80%  stenosis. Adjacent diag with 40% prox disease Lcx: No significant disease    Successful IVUS guided percutaneous coronary intervention mid LAD        PTCA and stent placement 2.75 X 15 mm Onyx Frontier drug-eluting stent        Post dilatation using 3.25X8 mm [place] balloon at 18 atm   Echocardiogram 07/27/2021: Left ventricular systolic function is normal.  LV ejection fraction = 55-60%.  There is aortic valve sclerosis.  There is no aortic stenosis.  There is no comparison study available.    Recent labs: 08/26/2022: Glucose 137, BUN/Cr 24/1.37. EGFR 62. Na/K 140/4.8. Rest of the CMP normal H/H 12/39. MCV 91. Platelets 198 HbA1C 7.4% Chol 95, TG 136, HDL 47, LDL 36  05/03/2022: Chol 164, TG 186, HDL 45,  LDL 87  10/20/2021: Chol 154, TG 221, HDL 45,  LDL 73  09/23/2021: Glucose 237, BUN/Cr 25/1.36. EGFR 58. Na/K 144/4.7.  H/H 14/45. MCV 90. Platelets 246    Review of Systems  Constitutional: Negative for malaise/fatigue.  Cardiovascular:  Negative for chest pain, dyspnea on exertion, leg swelling, palpitations and syncope.         Vitals:   11/09/22 1246  BP: 134/77  Pulse: 85  Resp: 19  SpO2: 97%     Body mass index is 44.13 kg/m. Filed Weights   11/09/22 1246  Weight: 265 lb 3.2 oz (120.3 kg)     Objective:   Physical Exam Vitals and nursing note reviewed.  Constitutional:      General: He is not in acute distress.    Appearance: He is obese.  Neck:     Vascular: No JVD.  Cardiovascular:     Rate and Rhythm: Normal rate and regular rhythm.     Pulses: Normal pulses.     Heart sounds: Normal heart sounds. No murmur heard. Pulmonary:     Effort: Pulmonary effort is normal.     Breath sounds: Normal breath sounds. No wheezing or rales.  Musculoskeletal:     Right lower leg: No edema.     Left lower leg: No edema.          ICD-10-CM   1. Coronary artery disease involving native coronary artery of native heart with other form of angina  pectoris ([place])  I25.118 EKG 12-Lead    2. Essential hypertension  I10     3. Mixed hyperlipidemia  E78.2        Assessment & Recommendations:    67 year old Caucasian male with hypertension, hyperlipidemia, type 2 diabetes mellitus, CAD   CAD: Improvement in exertional dyspnea after mid LAD PCI (12/2021). Stop Plavix. Continue Aspirin 81 mg daily. Has had myalgias with most statins.  Chol 95, TG 136, HDL 47, LDL 36 (08/2022). Continue Repatha, pravastatin. Okay to stop Zetia.  Mixed hyperlipidemia: As above.  Hypertension: Well-controlled  Obesity, type 2 DM: BMI  44, known CAD< type 2 DM,. Recommend starting Ozempic at 0.25 mg weekly, uptitrate to 0.5 mg in 4 weeks, as tolerated. Discussed avoiding greasy food, eating smaller amounts at a time, to avoid side effects, similar to what he had with Trulicity.  With initiation of Ozempic, I have asked him to reduce glimperide from 5 mg bid to 2.5 mg bid. I have also encouraged him to touch base with his PCP Dr. Unk Lightning in next few weeks.   F/u in 4 weeks    Nigel Mormon, MD Pager: [phone] Office: [phone]

## 2022-11-16 ENCOUNTER — Other Ambulatory Visit: Payer: Self-pay

## 2022-11-16 DIAGNOSIS — E119 Type 2 diabetes mellitus without complications: Secondary | ICD-10-CM

## 2022-11-16 DIAGNOSIS — I25118 Atherosclerotic heart disease of native coronary artery with other forms of angina pectoris: Secondary | ICD-10-CM

## 2022-11-16 MED ORDER — SEMAGLUTIDE(0.25 OR 0.5MG/DOS) 2 MG/3ML ~~LOC~~ SOPN: 0.2500 mg | PEN_INJECTOR | SUBCUTANEOUS | 1 refills | Status: DC

## 2022-11-26 ENCOUNTER — Other Ambulatory Visit: Payer: Self-pay | Admitting: Cardiology

## 2022-11-28 ENCOUNTER — Other Ambulatory Visit: Payer: Self-pay | Admitting: Cardiology

## 2022-12-07 ENCOUNTER — Ambulatory Visit: Payer: PPO | Admitting: Cardiology

## 2022-12-07 ENCOUNTER — Encounter: Payer: Self-pay | Admitting: Cardiology

## 2022-12-07 VITALS — BP 141/77 | HR 83 | Resp 16 | Ht 65.0 in | Wt 264.4 lb

## 2022-12-07 DIAGNOSIS — E782 Mixed hyperlipidemia: Secondary | ICD-10-CM

## 2022-12-07 DIAGNOSIS — I10 Essential (primary) hypertension: Secondary | ICD-10-CM

## 2022-12-07 DIAGNOSIS — I25118 Atherosclerotic heart disease of native coronary artery with other forms of angina pectoris: Secondary | ICD-10-CM

## 2022-12-07 NOTE — Progress Notes (Signed)
Patient self referred for dyspnea on exertion, chest pain, abnormal stress test  Subjective:   Gary Jimenez, male    DOB: [birth year], 67 y.o.   MRN: [id number]  No chief complaint on file.   HPI  67 y/o Caucasian male with hypertension, hyperlipidemia, uncontrolled type 2 DM, CAD  Patient underwent successful PCI to mid LAD.single vessel CAD (mid LAD) (12/2021).He is doing very well, denies chest pain, shortness of breath, palpitations, leg edema, orthopnea, PND, TIA/syncope.  Patient was started on Ozempic at last visit.  After 2 injections, he had to stop due to severe side effects of nausea, bloating, abdominal pain.  He did notice that abdominal pain slightly improved after drinking a fizzy drink.   Initial consultation visit 09/2021: Patient is here today with his wife.  Patient worked at [employer] for several years, retired in 2019.  He is to be quite active doing yard work over several acres without much difficulty, until summer 2022.  Over the last 6 months, he has had progressive worsening of dyspnea on exertion.  Now, he struggles to do minimal yard work.  He has only occasional episodes of chest pain on exertion, but this is not a common feature.  He does have controlled hypertension and hyperlipidemia.  He has had stable weight around 260 pounds until about a month ago, and he is gained 6 pounds.  He has OSA, regularly uses CPAP.  He has controlled hypertension and type 2 diabetes mellitus.  Lipids are reasonably well controlled.  He has not tolerated statins-Crestor Lipitor, in the past due to myalgias.  He currently takes Zetia.  Patient was last seen by cardiologist Dr. Abran Richard at [facility] on 09/01/2021.  Patient underwent work-up for chest pain and dyspnea at their office.  Echocardiogram showed normal EF, no significant valvular abnormality.  Stress test showed moderate size inferior wall, partially reversible defect.  Given the patient's  dyspnea had improved and he has not had any significant recurrent chest discomfort, option of medical management versus invasive work-up was offered to the patient.  However, patient wanted to seek second opinion with our practice since his wife is also established patient of ours.   Current Outpatient Medications:    amLODipine (NORVASC) 10 MG tablet, Take 10 mg by mouth daily., Disp: , Rfl:    aspirin EC 81 MG tablet, Take 1 tablet (81 mg total) by mouth daily., Disp: 90 tablet, Rfl: 3   Cyanocobalamin (VITAMIN B-12 PO), Take 2,500 mcg by mouth daily., Disp: , Rfl:    Evolocumab (REPATHA SURECLICK) [id number] MG/ML SOAJ, Inject 140 mg into the skin every 14 (fourteen) days., Disp: 6 mL, Rfl: 5   fluticasone (FLONASE) 50 MCG/ACT nasal spray, Place 1 spray into both nostrils daily as needed for allergies or rhinitis (Congestion and ear congestion)., Disp: , Rfl:    glipiZIDE (GLUCOTROL) 5 MG tablet, Take 2.5 mg by mouth 2 (two) times daily before a meal., Disp: , Rfl:    hydrochlorothiazide (HYDRODIURIL) 25 MG tablet, Take 25 mg by mouth daily., Disp: , Rfl:    metFORMIN (GLUCOPHAGE-XR) 500 MG 24 hr tablet, Take 2,000 mg by mouth 2 (two) times daily. 2 tab in the morning, 2 at night, Disp: , Rfl:    olmesartan (BENICAR) 40 MG tablet, Take 40 mg by mouth daily., Disp: , Rfl:    pantoprazole (PROTONIX) 40 MG tablet, TAKE ONE TABLET BY MOUTH DAILY, Disp: 90 tablet, Rfl: 3   pravastatin (PRAVACHOL)  40 MG tablet, Take 1 tablet (40 mg total) by mouth every evening., Disp: 90 tablet, Rfl: 3   Semaglutide,0.25 or 0.'5MG'$ /DOS, 2 MG/3ML SOPN, Inject 0.25 mg into the skin once a week., Disp: 3 mL, Rfl: 1   Vitamin D3 (VITAMIN D) 25 MCG tablet, Take 1,000 Units by mouth daily., Disp: , Rfl:     Cardiovascular and other pertinent studies:  EKG 11/09/2022: Sinus rhythm 83 bpm Occasional PAC    Coronary intervention 12/28/2021: LM: Normal LAD: Mid 80% stenosis. Adjacent diag with 40% prox disease Lcx: No  significant disease    Successful IVUS guided percutaneous coronary intervention mid LAD        PTCA and stent placement 2.75 X 15 mm Onyx Frontier drug-eluting stent        Post dilatation using 3.25X8 mm [place] balloon at 18 atm   Echocardiogram 07/27/2021: Left ventricular systolic function is normal.  LV ejection fraction = 55-60%.  There is aortic valve sclerosis.  There is no aortic stenosis.  There is no comparison study available.    Recent labs: 08/26/2022: Glucose 137, BUN/Cr 24/1.37. EGFR 62. Na/K 140/4.8. Rest of the CMP normal H/H 12/39. MCV 91. Platelets 198 HbA1C 7.4% Chol 95, TG 136, HDL 47, LDL 36  05/03/2022: Chol 164, TG 186, HDL 45,  LDL 87  10/20/2021: Chol 154, TG 221, HDL 45,  LDL 73  09/23/2021: Glucose 237, BUN/Cr 25/1.36. EGFR 58. Na/K 144/4.7.  H/H 14/45. MCV 90. Platelets 246    Review of Systems  Constitutional: Negative for malaise/fatigue.  Cardiovascular:  Negative for chest pain, dyspnea on exertion, leg swelling, palpitations and syncope.  Gastrointestinal:  Positive for bloating, abdominal pain, nausea and vomiting.         Vitals:   12/07/22 1322  BP: (!) 141/77  Pulse: 83  Resp: 16  SpO2: 97%     Body mass index is 44 kg/m. Filed Weights   12/07/22 1322  Weight: 264 lb 6.4 oz (119.9 kg)     Objective:   Physical Exam Vitals and nursing note reviewed.  Constitutional:      General: He is not in acute distress.    Appearance: He is obese.  Neck:     Vascular: No JVD.  Cardiovascular:     Rate and Rhythm: Normal rate and regular rhythm.     Pulses: Normal pulses.     Heart sounds: Normal heart sounds. No murmur heard. Pulmonary:     Effort: Pulmonary effort is normal.     Breath sounds: Normal breath sounds. No wheezing or rales.  Musculoskeletal:     Right lower leg: No edema.     Left lower leg: No edema.        No diagnosis found.    Assessment & Recommendations:    67 year old Caucasian male with  hypertension, hyperlipidemia, type 2 diabetes mellitus, CAD   CAD: Improvement in exertional dyspnea after mid LAD PCI (12/2021). Stop Plavix. Continue Aspirin 81 mg daily. Has had myalgias with most statins.  Chol 95, TG 136, HDL 47, LDL 36 (08/2022). Continue Repatha, pravastatin. Okay to stop Zetia.  Mixed hyperlipidemia: As above.  Hypertension: Well-controlled  Obesity, type 2 DM: BMI 44, known CAD< type 2 DM. Significant GI side effects to Ozempic.  We discussed about holding Ozempic for couple weeks.  He is open to giving it another try.  I recommended him to reduce his food on plate by [id number], and avoid any greasy foods.  He may drink  few sips of diet soda if it helps his symptoms. While on Ozempic, keep glimepiride dose reduced to 2.5 mg twice daily.  F/u in 6 weeks    Nigel Mormon, MD Pager: [phone] Office: [phone]

## 2023-01-19 ENCOUNTER — Encounter: Payer: Self-pay | Admitting: Cardiology

## 2023-01-19 ENCOUNTER — Ambulatory Visit: Payer: PPO | Admitting: Cardiology

## 2023-01-19 VITALS — BP 142/75 | HR 86 | Ht 65.0 in | Wt 265.0 lb

## 2023-01-19 DIAGNOSIS — R0609 Other forms of dyspnea: Secondary | ICD-10-CM

## 2023-01-19 DIAGNOSIS — E782 Mixed hyperlipidemia: Secondary | ICD-10-CM

## 2023-01-19 DIAGNOSIS — I25118 Atherosclerotic heart disease of native coronary artery with other forms of angina pectoris: Secondary | ICD-10-CM

## 2023-01-19 DIAGNOSIS — I10 Essential (primary) hypertension: Secondary | ICD-10-CM

## 2023-01-19 MED ORDER — FUROSEMIDE 40 MG PO TABS: 40.0000 mg | ORAL_TABLET | Freq: Every day | ORAL | 3 refills | Status: DC

## 2023-01-19 NOTE — Progress Notes (Signed)
Patient self referred for dyspnea on exertion, chest pain, abnormal stress test  Subjective:   Gary Jimenez, male    DOB: July 25, 1956, 67 y.o.   MRN: [id number]  Chief Complaint  Patient presents with   Coronary Artery Disease   Follow-up   Results     HPI  67 y/o Caucasian male with hypertension, hyperlipidemia, uncontrolled type 2 DM, CAD  Patient has not been able to tolerate Ozempic as he could not tolerate GI side effects.  Recently, he has developed exertional dyspnea, similar to what he experienced prior to his PCI in 2023.  He denies any chest pain.  Initial consultation visit 09/2021: Patient is here today with his wife.  Patient worked at [employer] for several years, retired in 2019.  He is to be quite active doing yard work over several acres without much difficulty, until summer 2022.  Over the last 6 months, he has had progressive worsening of dyspnea on exertion.  Now, he struggles to do minimal yard work.  He has only occasional episodes of chest pain on exertion, but this is not a common feature.  He does have controlled hypertension and hyperlipidemia.  He has had stable weight around 260 pounds until about a month ago, and he is gained 6 pounds.  He has OSA, regularly uses CPAP.  He has controlled hypertension and type 2 diabetes mellitus.  Lipids are reasonably well controlled.  He has not tolerated statins-Crestor Lipitor, in the past due to myalgias.  He currently takes Zetia.  Patient was last seen by cardiologist Dr. Vernona Rieger at [facility] on 09/01/2021.  Patient underwent work-up for chest pain and dyspnea at their office.  Echocardiogram showed normal EF, no significant valvular abnormality.  Stress test showed moderate size inferior wall, partially reversible defect.  Given the patient's dyspnea had improved and he has not had any significant recurrent chest discomfort, option of medical management versus invasive work-up was offered  to the patient.  However, patient wanted to seek second opinion with our practice since his wife is also established patient of ours.   Current Outpatient Medications:    amLODipine (NORVASC) 10 MG tablet, Take 10 mg by mouth daily., Disp: , Rfl:    aspirin EC 81 MG tablet, Take 1 tablet (81 mg total) by mouth daily., Disp: 90 tablet, Rfl: 3   Cyanocobalamin (VITAMIN B-12 PO), Take 2,500 mcg by mouth daily., Disp: , Rfl:    Evolocumab (REPATHA SURECLICK) 140 MG/ML SOAJ, Inject 140 mg into the skin every 14 (fourteen) days., Disp: 6 mL, Rfl: 5   fluticasone (FLONASE) 50 MCG/ACT nasal spray, Place 1 spray into both nostrils daily as needed for allergies or rhinitis (Congestion and ear congestion)., Disp: , Rfl:    glipiZIDE (GLUCOTROL) 5 MG tablet, Take 2.5 mg by mouth 2 (two) times daily before a meal., Disp: , Rfl:    hydrochlorothiazide (HYDRODIURIL) 25 MG tablet, Take 25 mg by mouth daily., Disp: , Rfl:    metFORMIN (GLUCOPHAGE-XR) 500 MG 24 hr tablet, Take 2,000 mg by mouth 2 (two) times daily. 2 tab in the morning, 2 at night, Disp: , Rfl:    olmesartan (BENICAR) 40 MG tablet, Take 40 mg by mouth daily., Disp: , Rfl:    pravastatin (PRAVACHOL) 40 MG tablet, Take 1 tablet (40 mg total) by mouth every evening., Disp: 90 tablet, Rfl: 3   Vitamin D3 (VITAMIN D) 25 MCG tablet, Take 1,000 Units by mouth daily., Disp: ,  Rfl:     Cardiovascular and other pertinent studies:  EKG 11/09/2022: Sinus rhythm 83 bpm Occasional PAC    Coronary intervention 12/28/2021: LM: Normal LAD: Mid 80% stenosis. Adjacent diag with 40% prox disease Lcx: No significant disease    Successful IVUS guided percutaneous coronary intervention mid LAD        PTCA and stent placement 2.75 X 15 mm Onyx Frontier drug-eluting stent        Post dilatation using 3.25X8 mm [place] balloon at 18 atm   Echocardiogram 07/27/2021: Left ventricular systolic function is normal.  LV ejection fraction = 55-60%.  There is aortic valve  sclerosis.  There is no aortic stenosis.  There is no comparison study available.    Recent labs: 08/26/2022: Glucose 137, BUN/Cr 24/1.37. EGFR 62. Na/K 140/4.8. Rest of the CMP normal H/H 12/39. MCV 91. Platelets 198 HbA1C 7.4% Chol 95, TG 136, HDL 47, LDL 36  05/03/2022: Chol 164, TG 186, HDL 45,  LDL 87  10/20/2021: Chol 154, TG 221, HDL 45,  LDL 73  09/23/2021: Glucose 237, BUN/Cr 25/1.36. EGFR 58. Na/K 144/4.7.  H/H 14/45. MCV 90. Platelets 246    Review of Systems  Constitutional: Negative for malaise/fatigue.  Cardiovascular:  Negative for chest pain, dyspnea on exertion, leg swelling, palpitations and syncope.  Gastrointestinal:  Positive for bloating, abdominal pain, nausea and vomiting.         Vitals:   01/19/23 1319  BP: (!) 142/75  Pulse: 86  SpO2: 96%     Body mass index is 44.1 kg/m. Filed Weights   01/19/23 1319  Weight: 265 lb (120.2 kg)     Objective:   Physical Exam Vitals and nursing note reviewed.  Constitutional:      General: He is not in acute distress.    Appearance: He is obese.  Neck:     Vascular: No JVD.  Cardiovascular:     Rate and Rhythm: Normal rate and regular rhythm.     Pulses: Normal pulses.     Heart sounds: Normal heart sounds. No murmur heard. Pulmonary:     Effort: Pulmonary effort is normal.     Breath sounds: Normal breath sounds. No wheezing or rales.  Musculoskeletal:     Right lower leg: Edema (Trace) present.     Left lower leg: Edema (1+) present.          ICD-10-CM   1. Dyspnea on exertion  R06.09 Basic metabolic panel    Pro b natriuretic peptide (BNP)[id number]    PCV MYOCARDIAL PERFUSION WO LEXISCAN    PCV ECHOCARDIOGRAM COMPLETE    2. Coronary artery disease involving native coronary artery of native heart with other form of angina pectoris  I25.118     3. Essential hypertension  I10     4. Mixed hyperlipidemia  E78.2        Assessment & Recommendations:     67 year old Caucasian male with hypertension, hyperlipidemia, type 2 diabetes mellitus, CAD   CAD: Exertional dyspnea, similar to prior to his mid LAD PCI (12/2021). Recommend echocardiogram and exercise nuclear stress test. In addition, changes chlorthalidone to Lasix 40 mg daily for better diuresis, as it may help with exertional dyspnea. Check BMP, and proBNP next week.   Continue Aspirin 81 mg daily. Has had myalgias with most statins.  Chol 95, TG 136, HDL 47, LDL 36 (08/2022). Continue Repatha, pravastatin.   Mixed hyperlipidemia: As above.  Hypertension: Well-controlled  Obesity, type 2 DM: Unfortunately,  he could not tolerate Ozempic and therefore discontinued.  F/u in 6 weeks    Elder Negus, MD Pager: [phone] Office: [phone]

## 2023-01-25 ENCOUNTER — Other Ambulatory Visit: Payer: Self-pay | Admitting: Cardiology

## 2023-01-25 DIAGNOSIS — E119 Type 2 diabetes mellitus without complications: Secondary | ICD-10-CM

## 2023-01-31 LAB — PRO B NATRIURETIC PEPTIDE: NT-Pro BNP: <36 pg/mL (ref 0–376)

## 2023-02-02 ENCOUNTER — Ambulatory Visit: Payer: PPO

## 2023-02-02 DIAGNOSIS — R0609 Other forms of dyspnea: Secondary | ICD-10-CM

## 2023-02-14 ENCOUNTER — Ambulatory Visit: Payer: PPO

## 2023-02-14 DIAGNOSIS — R0609 Other forms of dyspnea: Secondary | ICD-10-CM

## 2023-02-15 NOTE — Progress Notes (Signed)
Patient self referred for dyspnea on exertion, chest pain, abnormal stress test  Subjective:   Gary Jimenez, male    DOB: 1955-10-24, 67 y.o.   MRN: [id number]  Chief Complaint  Patient presents with   Coronary Artery Disease   Follow-up    4 week     HPI  67 y/o Caucasian male with hypertension, hyperlipidemia, uncontrolled type 2 DM, CAD  He continues to have multiple complaints, including generalized fatigue, back pain, dyspnea on exertion, but no specific chest pain. Reviewed recent test results with the patient, details below.    Initial consultation visit 09/2021: Patient is here today with his wife.  Patient worked at [employer] for several years, retired in 2019.  He is to be quite active doing yard work over several acres without much difficulty, until summer 2022.  Over the last 6 months, he has had progressive worsening of dyspnea on exertion.  Now, he struggles to do minimal yard work.  He has only occasional episodes of chest pain on exertion, but this is not a common feature.  He does have controlled hypertension and hyperlipidemia.  He has had stable weight around 260 pounds until about a month ago, and he is gained 6 pounds.  He has OSA, regularly uses CPAP.  He has controlled hypertension and type 2 diabetes mellitus.  Lipids are reasonably well controlled.  He has not tolerated statins-Crestor Lipitor, in the past due to myalgias.  He currently takes Zetia.  Patient was last seen by cardiologist Dr. Vernona Rieger at [facility] on 09/01/2021.  Patient underwent work-up for chest pain and dyspnea at their office.  Echocardiogram showed normal EF, no significant valvular abnormality.  Stress test showed moderate size inferior wall, partially reversible defect.  Given the patient's dyspnea had improved and he has not had any significant recurrent chest discomfort, option of medical management versus invasive work-up was offered to the patient.   However, patient wanted to seek second opinion with our practice since his wife is also established patient of ours.   Current Outpatient Medications:    amLODipine (NORVASC) 10 MG tablet, Take 10 mg by mouth daily., Disp: , Rfl:    aspirin EC 81 MG tablet, Take 1 tablet (81 mg total) by mouth daily., Disp: 90 tablet, Rfl: 3   Cyanocobalamin (VITAMIN B-12 PO), Take 2,500 mcg by mouth daily., Disp: , Rfl:    Evolocumab (REPATHA SURECLICK) 140 MG/ML SOAJ, Inject 140 mg into the skin every 14 (fourteen) days., Disp: 6 mL, Rfl: 5   fluticasone (FLONASE) 50 MCG/ACT nasal spray, Place 1 spray into both nostrils daily as needed for allergies or rhinitis (Congestion and ear congestion)., Disp: , Rfl:    furosemide (LASIX) 40 MG tablet, Take 1 tablet (40 mg total) by mouth daily., Disp: 30 tablet, Rfl: 3   glipiZIDE (GLUCOTROL) 5 MG tablet, Take 2.5 mg by mouth 2 (two) times daily before a meal., Disp: , Rfl:    metFORMIN (GLUCOPHAGE-XR) 500 MG 24 hr tablet, Take 2,000 mg by mouth 2 (two) times daily. 2 tab in the morning, 2 at night, Disp: , Rfl:    olmesartan (BENICAR) 40 MG tablet, Take 40 mg by mouth daily., Disp: , Rfl:    pravastatin (PRAVACHOL) 40 MG tablet, TAKE ONE TABLET BY MOUTH EVERY EVENING, Disp: 90 tablet, Rfl: 3   Vitamin D3 (VITAMIN D) 25 MCG tablet, Take 1,000 Units by mouth daily., Disp: , Rfl:     Cardiovascular  and other pertinent studies:  EKG 11/09/2022: Sinus rhythm 83 bpm Occasional PAC    Echocardiogram 02/14/2023: Normal LV systolic function with visual EF 60-65%. Left ventricle cavity is normal in size. Normal global wall motion. Normal diastolic filling pattern, normal LAP. Calculated EF 65%. Structurally normal tricuspid valve with trace regurgitation. No evidence of pulmonary hypertension. No prior available for comparison.  Exercise nuclear stress test 02/02/2023 Myocardial perfusion is abnormal. Significant soft tissue attenuation is present however inferior ischemia  cannot be definitively ruled out. Overall LV systolic function is normal without regional wall motion abnormalities. Stress LV EF: 64%. Low risk study. Nondiagnostic ECG stress due to target HR not achieved. The heart rate response was normal. The blood pressure response was normal. No previous exam available for comparison.    Coronary intervention 12/28/2021: LM: Normal LAD: Mid 80% stenosis. Adjacent diag with 40% prox disease Lcx: No significant disease    Successful IVUS guided percutaneous coronary intervention mid LAD        PTCA and stent placement 2.75 X 15 mm Onyx Frontier drug-eluting stent        Post dilatation using 3.25X8 mm  balloon at 18 atm   Echocardiogram 07/27/2021: Left ventricular systolic function is normal.  LV ejection fraction = 55-60%.  There is aortic valve sclerosis.  There is no aortic stenosis.  There is no comparison study available.    Recent labs: 08/26/2022: Glucose 137, BUN/Cr 24/1.37. EGFR 62. Na/K 140/4.8. Rest of the CMP normal H/H 12/39. MCV 91. Platelets 198 HbA1C 7.4% Chol 95, TG 136, HDL 47, LDL 36  05/03/2022: Chol 164, TG 186, HDL 45,  LDL 87  10/20/2021: Chol 154, TG 221, HDL 45,  LDL 73  09/23/2021: Glucose 237, BUN/Cr 25/1.36. EGFR 58. Na/K 144/4.7.  H/H 14/45. MCV 90. Platelets 246    Review of Systems  Constitutional: Positive for malaise/fatigue.  Cardiovascular:  Positive for dyspnea on exertion. Negative for chest pain, leg swelling, palpitations and syncope.  Musculoskeletal:  Positive for back pain.  Gastrointestinal:  Positive for bloating, abdominal pain, nausea and vomiting.         Vitals:   02/24/23 1403  BP: 106/70  Pulse: 81  Resp: 16  SpO2: 98%     Body mass index is 43.43 kg/m. Filed Weights   02/24/23 1403  Weight: 261 lb (118.4 kg)     Objective:   Physical Exam Vitals and nursing note reviewed.  Constitutional:      General: He is not in acute distress.    Appearance: He is  obese.  Neck:     Vascular: No JVD.  Cardiovascular:     Rate and Rhythm: Normal rate and regular rhythm.     Pulses: Normal pulses.     Heart sounds: Normal heart sounds. No murmur heard. Pulmonary:     Effort: Pulmonary effort is normal.     Breath sounds: Normal breath sounds. No wheezing or rales.  Musculoskeletal:     Right lower leg: No edema.     Left lower leg: No edema.    Visit diagnoses:       ICD-10-CM   1. Dyspnea on exertion  R06.09     2. Coronary artery disease involving native coronary artery of native heart with other form of angina pectoris (HCC)  I25.118     3. Essential hypertension  I10        Assessment & Recommendations:    67 year old Caucasian male with hypertension, hyperlipidemia, type 2 diabetes  mellitus, CAD   CAD: Exertional dyspnea but no specific chest pain. Nuclear stress test with possible inferior attenuation but no large area of ischemia. No significant echocardiogram abnormalities to explain dyspnea (02/2023). Obesity very likely contributing. He did not tolerate SGLT1i in the past. Strongly consider surgical weight loss. Continue Aspirin, statin, Repatha.  Mixed hyperlipidemia: As above.  Hypertension: Well-controlled  F/u in 2 months    Elder Negus, MD Pager: [phone] Office: [phone]

## 2023-02-24 ENCOUNTER — Ambulatory Visit: Payer: PPO | Admitting: Cardiology

## 2023-02-24 ENCOUNTER — Encounter: Payer: Self-pay | Admitting: Cardiology

## 2023-02-24 VITALS — BP 106/70 | HR 81 | Resp 16 | Ht 65.0 in | Wt 261.0 lb

## 2023-02-24 DIAGNOSIS — I10 Essential (primary) hypertension: Secondary | ICD-10-CM

## 2023-02-24 DIAGNOSIS — R0609 Other forms of dyspnea: Secondary | ICD-10-CM

## 2023-02-24 DIAGNOSIS — I25118 Atherosclerotic heart disease of native coronary artery with other forms of angina pectoris: Secondary | ICD-10-CM

## 2023-03-14 IMAGING — DX DG CHEST 2V
2 series · 2 of 2 positions shown · non-contrast
Comparison: Chest radiograph dated 08/21/2013.

CLINICAL DATA: Shortness of breath.

EXAM:
CHEST - 2 VIEW

[chest pa]
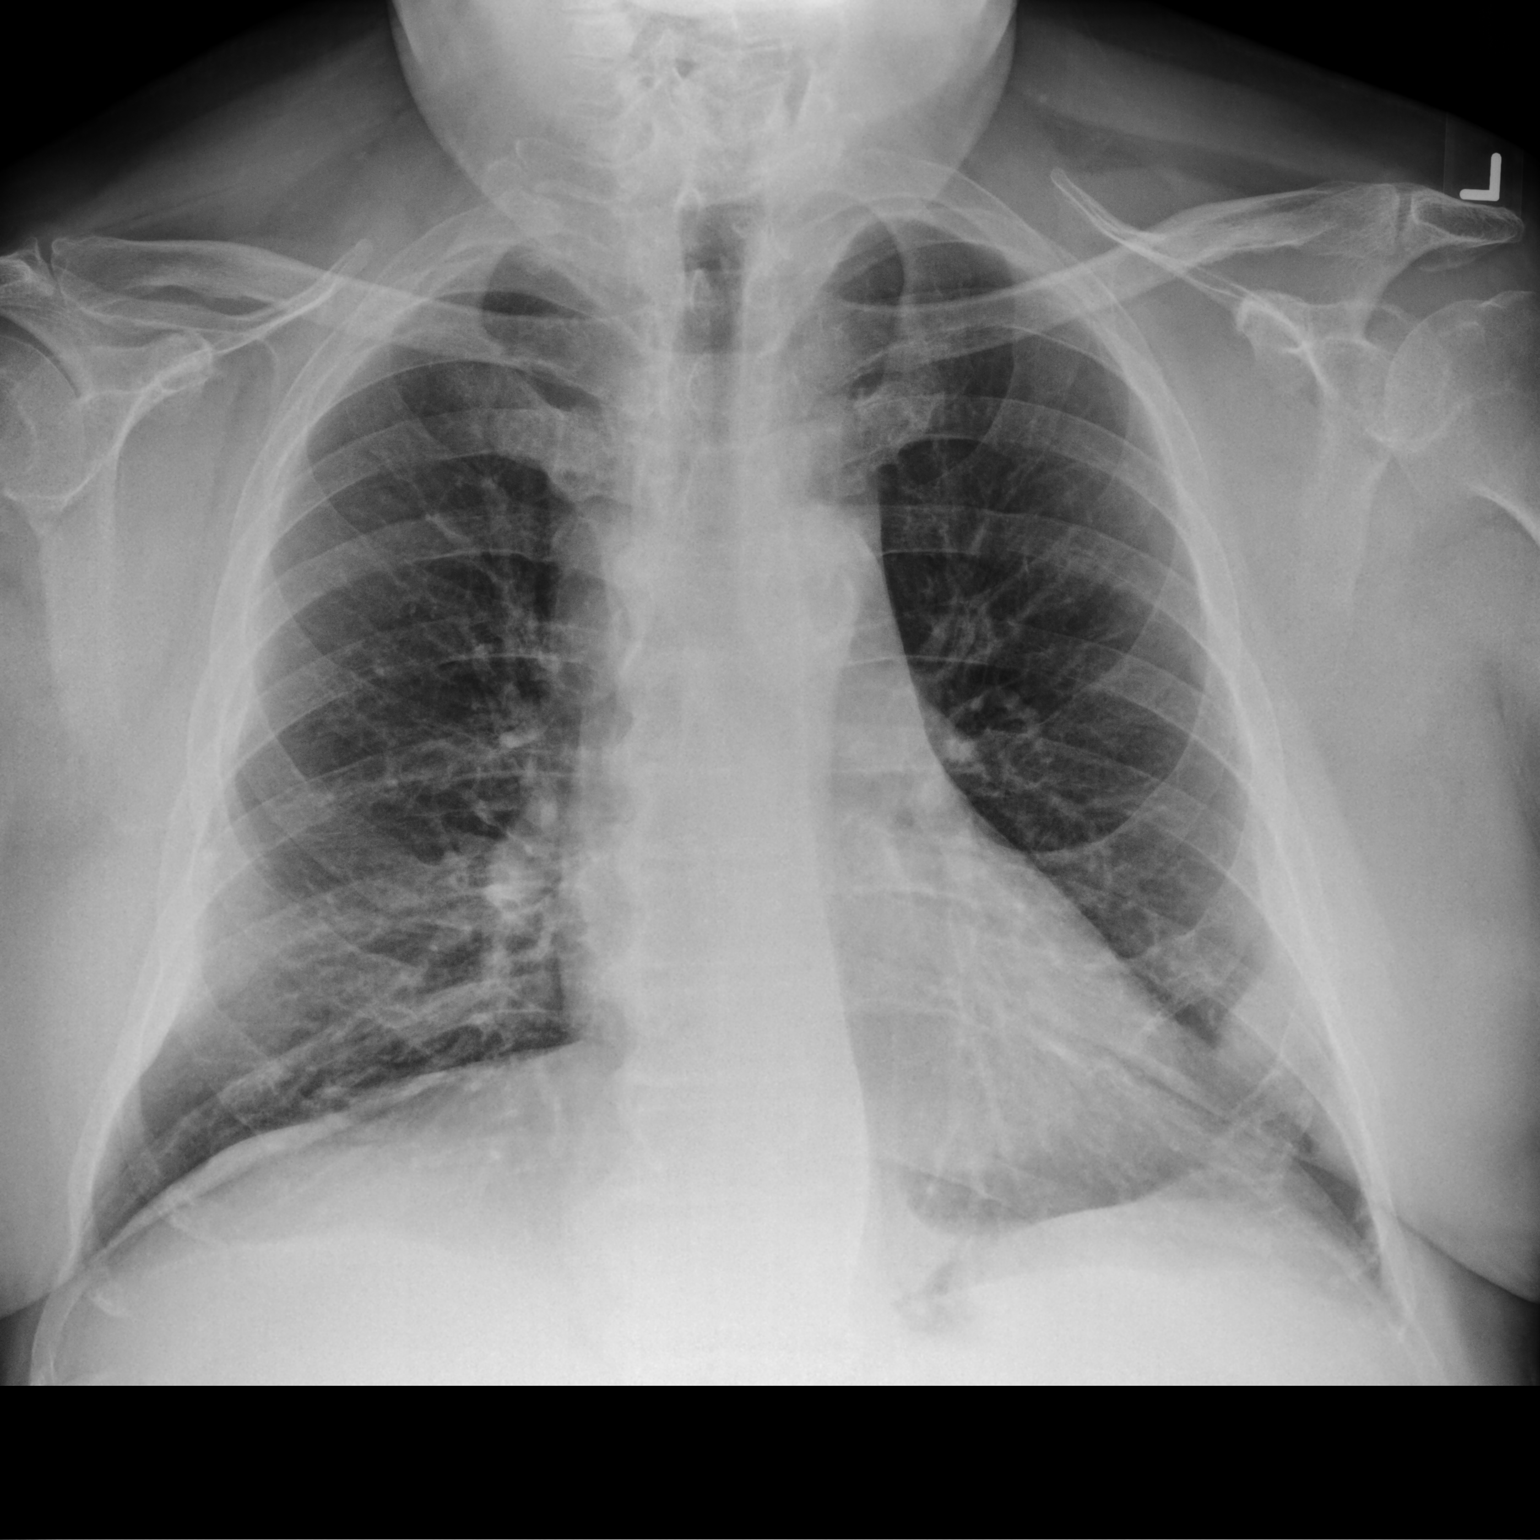

[chest lat]
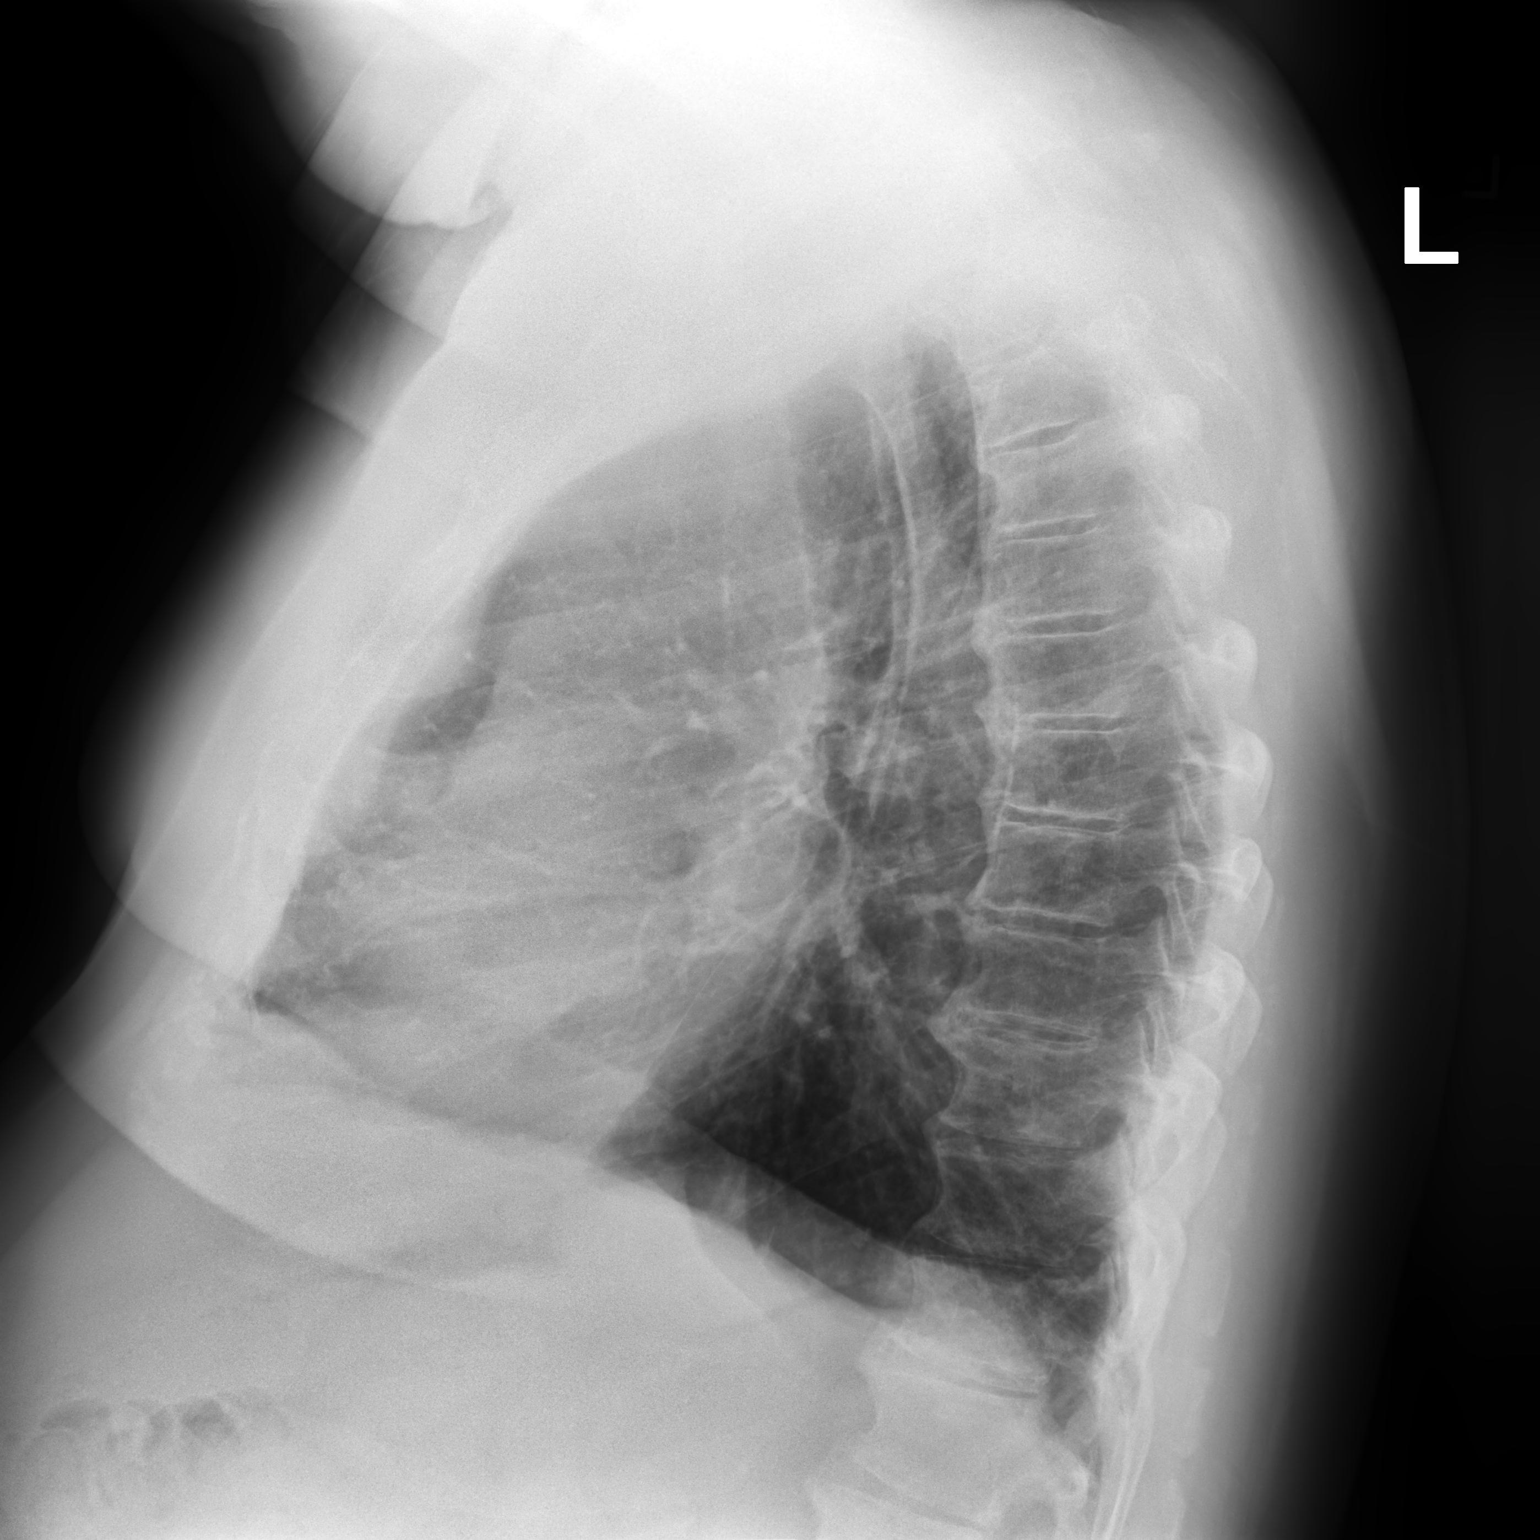

[2 of 2 positions shown; findings below may reference images not displayed]

FINDINGS: No focal consolidation, pleural effusion, pneumothorax. Mild
interstitial coarsening. The cardiac silhouette is within limits. No
acute osseous pathology. Degenerative changes of the spine.
IMPRESSION: No active cardiopulmonary disease.

## 2023-04-19 ENCOUNTER — Ambulatory Visit: Payer: HMO | Admitting: Physician Assistant

## 2023-04-19 DIAGNOSIS — Z23 Encounter for immunization: Secondary | ICD-10-CM

## 2023-04-19 LAB — AMB RESULTS CONSOLE CBG: Glucose: 186

## 2023-04-19 LAB — HEMOGLOBIN A1C: Hemoglobin A1C: 7.2

## 2023-04-20 ENCOUNTER — Ambulatory Visit: Payer: PPO | Admitting: Cardiology

## 2023-04-20 ENCOUNTER — Encounter: Payer: Self-pay | Admitting: Cardiology

## 2023-04-20 VITALS — BP 120/73 | HR 88 | Resp 16 | Ht 65.0 in | Wt 260.0 lb

## 2023-04-20 DIAGNOSIS — E782 Mixed hyperlipidemia: Secondary | ICD-10-CM

## 2023-04-20 DIAGNOSIS — I10 Essential (primary) hypertension: Secondary | ICD-10-CM

## 2023-04-20 DIAGNOSIS — I25118 Atherosclerotic heart disease of native coronary artery with other forms of angina pectoris: Secondary | ICD-10-CM

## 2023-04-20 NOTE — Progress Notes (Signed)
Patient self referred for dyspnea on exertion, chest pain, abnormal stress test  Subjective:   Gary Jimenez, male    DOB: 1956-08-29, 67 y.o.   MRN: [id number]  Chief Complaint  Patient presents with   Coronary artery disease involving native coronary artery of   Follow-up    2 months     HPI  67 y/o Caucasian male with hypertension, hyperlipidemia, uncontrolled type 2 DM, CAD  He continues to have generalized fatigue and exertional dyspnea, but denies any chest pain. Blood pressure is controlled. He states that he has lost 10 lb, although weight checks in the office do not entirely match that.  Initial consultation visit 09/2021: Patient is here today with his wife.  Patient worked at [employer] for several years, retired in 2019.  He is to be quite active doing yard work over several acres without much difficulty, until summer 2022.  Over the last 6 months, he has had progressive worsening of dyspnea on exertion.  Now, he struggles to do minimal yard work.  He has only occasional episodes of chest pain on exertion, but this is not a common feature.  He does have controlled hypertension and hyperlipidemia.  He has had stable weight around 260 pounds until about a month ago, and he is gained 6 pounds.  He has OSA, regularly uses CPAP.  He has controlled hypertension and type 2 diabetes mellitus.  Lipids are reasonably well controlled.  He has not tolerated statins-Crestor Lipitor, in the past due to myalgias.  He currently takes Zetia.  Patient was last seen by cardiologist Dr. Vernona Rieger at [facility] on 09/01/2021.  Patient underwent work-up for chest pain and dyspnea at their office.  Echocardiogram showed normal EF, no significant valvular abnormality.  Stress test showed moderate size inferior wall, partially reversible defect.  Given the patient's dyspnea had improved and he has not had any significant recurrent chest discomfort, option of medical  management versus invasive work-up was offered to the patient.  However, patient wanted to seek second opinion with our practice since his wife is also established patient of ours.   Current Outpatient Medications:    amLODipine (NORVASC) 10 MG tablet, Take 10 mg by mouth daily., Disp: , Rfl:    aspirin EC 81 MG tablet, Take 1 tablet (81 mg total) by mouth daily., Disp: 90 tablet, Rfl: 3   Cyanocobalamin (VITAMIN B-12 PO), Take 2,500 mcg by mouth daily., Disp: , Rfl:    Evolocumab (REPATHA SURECLICK) 140 MG/ML SOAJ, Inject 140 mg into the skin every 14 (fourteen) days., Disp: 6 mL, Rfl: 5   fluticasone (FLONASE) 50 MCG/ACT nasal spray, Place 1 spray into both nostrils daily as needed for allergies or rhinitis (Congestion and ear congestion)., Disp: , Rfl:    furosemide (LASIX) 40 MG tablet, Take 1 tablet (40 mg total) by mouth daily., Disp: 30 tablet, Rfl: 3   glipiZIDE (GLUCOTROL) 5 MG tablet, Take 2.5 mg by mouth 2 (two) times daily before a meal., Disp: , Rfl:    metFORMIN (GLUCOPHAGE-XR) 500 MG 24 hr tablet, Take 2,000 mg by mouth 2 (two) times daily. 2 tab in the morning, 2 at night, Disp: , Rfl:    olmesartan (BENICAR) 40 MG tablet, Take 40 mg by mouth daily., Disp: , Rfl:    pravastatin (PRAVACHOL) 40 MG tablet, TAKE ONE TABLET BY MOUTH EVERY EVENING, Disp: 90 tablet, Rfl: 3   Vitamin D3 (VITAMIN D) 25 MCG tablet, Take 1,000  Units by mouth daily., Disp: , Rfl:     Cardiovascular and other pertinent studies:  EKG 11/09/2022: Sinus rhythm 83 bpm Occasional PAC    Echocardiogram 02/14/2023: Normal LV systolic function with visual EF 60-65%. Left ventricle cavity is normal in size. Normal global wall motion. Normal diastolic filling pattern, normal LAP. Calculated EF 65%. Structurally normal tricuspid valve with trace regurgitation. No evidence of pulmonary hypertension. No prior available for comparison.  Exercise nuclear stress test 02/02/2023: Myocardial perfusion is abnormal.  Significant soft tissue attenuation is present however inferior ischemia cannot be definitively ruled out. Overall LV systolic function is normal without regional wall motion abnormalities. Stress LV EF: 64%. Low risk study. Nondiagnostic ECG stress due to target HR not achieved. The heart rate response was normal. The blood pressure response was normal. No previous exam available for comparison.    Coronary intervention 12/28/2021: LM: Normal LAD: Mid 80% stenosis. Adjacent diag with 40% prox disease Lcx: No significant disease    Successful IVUS guided percutaneous coronary intervention mid LAD        PTCA and stent placement 2.75 X 15 mm Onyx Frontier drug-eluting stent        Post dilatation using 3.25X8 mm [place] balloon at 18 atm   Echocardiogram 07/27/2021: Left ventricular systolic function is normal.  LV ejection fraction = 55-60%.  There is aortic valve sclerosis.  There is no aortic stenosis.  There is no comparison study available.    Recent labs: 08/26/2022: Glucose 137, BUN/Cr 24/1.37. EGFR 62. Na/K 140/4.8. Rest of the CMP normal H/H 12/39. MCV 91. Platelets 198 HbA1C 7.4% Chol 95, TG 136, HDL 47, LDL 36  05/03/2022: Chol 164, TG 186, HDL 45,  LDL 87  10/20/2021: Chol 154, TG 221, HDL 45,  LDL 73  09/23/2021: Glucose 237, BUN/Cr 25/1.36. EGFR 58. Na/K 144/4.7.  H/H 14/45. MCV 90. Platelets 246    Review of Systems  Constitutional: Positive for malaise/fatigue.  Cardiovascular:  Positive for dyspnea on exertion. Negative for chest pain, leg swelling, palpitations and syncope.  Musculoskeletal:  Positive for back pain.  Gastrointestinal:  Positive for bloating, abdominal pain, nausea and vomiting.         Vitals:   04/20/23 1113  BP: 120/73  Pulse: 88  Resp: 16  SpO2: 95%      Body mass index is 43.27 kg/m. Filed Weights   04/20/23 1113  Weight: 260 lb (117.9 kg)      Objective:   Physical Exam Vitals and nursing note reviewed.   Constitutional:      General: He is not in acute distress.    Appearance: He is obese.  Neck:     Vascular: No JVD.  Cardiovascular:     Rate and Rhythm: Normal rate and regular rhythm.     Pulses: Normal pulses.     Heart sounds: Normal heart sounds. No murmur heard. Pulmonary:     Effort: Pulmonary effort is normal.     Breath sounds: Normal breath sounds. No wheezing or rales.  Musculoskeletal:     Right lower leg: No edema.     Left lower leg: No edema.    Visit diagnoses:       ICD-10-CM   1. Coronary artery disease involving native coronary artery of native heart with other form of angina pectoris (HCC)  I25.118     2. Essential hypertension  I10     3. Mixed hyperlipidemia  E78.2         Assessment &  Recommendations:    67 year old Caucasian male with hypertension, hyperlipidemia, type 2 diabetes mellitus, CAD   CAD: Exertional dyspnea but no specific chest pain. He did have chest pain prior to PCI in 12/2021. Nuclear stress test (02/2023) with possible inferior attenuation but no large area of ischemia. No significant echocardiogram abnormalities to explain dyspnea (02/2023). Obesity very likely contributing. He did not tolerate SGLT1i in the past. He does not want consider surgical weight loss. Continue Aspirin, statin, Repatha.  I would defer workup for fatigue to PCO. Consider TSH check.  Mixed hyperlipidemia: As above.  Hypertension: Well-controlled  F/u in 6 months    Elder Negus, MD Pager: [phone] Office: [phone]

## 2023-04-26 ENCOUNTER — Ambulatory Visit: Payer: PPO | Admitting: Cardiology

## 2023-04-28 NOTE — Progress Notes (Signed)
Pt attended screening event on 04/19/23, where screening results were Glucose 186, A1C 7.2, and BP wnl. At the event, Pt confirmed Dr. Sherral Hammers as his PCP and No SDOH insecurities was indicated. Per chart review, Pt was seen by PCP on 08/26/22 and has upcoming appointment on 05/23/23. Pt also has upcomming appointment 05/04/23 with Endo. Pt was contacted for A1C and Glucose follow up. During a call, Pt shared that he has upcoming appointment with Endo and he will share his screening results during the visit.

## 2023-05-15 ENCOUNTER — Other Ambulatory Visit: Payer: Self-pay | Admitting: Cardiology

## 2023-05-20 ENCOUNTER — Other Ambulatory Visit: Payer: Self-pay | Admitting: Cardiology

## 2023-05-20 DIAGNOSIS — I25118 Atherosclerotic heart disease of native coronary artery with other forms of angina pectoris: Secondary | ICD-10-CM

## 2023-05-20 DIAGNOSIS — E782 Mixed hyperlipidemia: Secondary | ICD-10-CM

## 2023-06-26 ENCOUNTER — Encounter: Payer: Self-pay | Admitting: *Deleted

## 2023-06-26 NOTE — Progress Notes (Signed)
Pt attended 04/19/23 screening event where his b/p was 111/77 and his blood sugar was 186 and his A1C was 7.2. At the event, the pt confirmed his PCP was Dr. Keturah Barre at [facility], and he did not identify any SDOH insecurities. During the initial f/u, pt shared he has an endocrinologist w/ whom he had an appt on 05/04/23. During this 60 day event f/u, chart review indicates 8/1 ov with endocrinologist documented event A1C of 7.2 and f/u r/t thyroid and DM control. Pt also completed his initial AWV and had a physical exam with his PCP on 05/23/23, where his blood pressure was 138/84 and his blood sugar was 148. Pt has future visits w/ his endocrinologist on 09/15/23 and his cardiologist on 10/20/23. No additional health equity team support indicated at this time.

## 2023-07-22 ENCOUNTER — Other Ambulatory Visit: Payer: Self-pay | Admitting: Cardiology

## 2023-07-22 DIAGNOSIS — E782 Mixed hyperlipidemia: Secondary | ICD-10-CM

## 2023-07-22 DIAGNOSIS — I25118 Atherosclerotic heart disease of native coronary artery with other forms of angina pectoris: Secondary | ICD-10-CM

## 2023-10-20 ENCOUNTER — Telehealth: Payer: Self-pay | Admitting: Pharmacist

## 2023-10-20 ENCOUNTER — Ambulatory Visit: Payer: HMO | Attending: Cardiology | Admitting: Cardiology

## 2023-10-20 ENCOUNTER — Encounter: Payer: Self-pay | Admitting: Cardiology

## 2023-10-20 VITALS — BP 114/66 | HR 91 | Resp 16 | Ht 65.0 in | Wt 257.0 lb

## 2023-10-20 DIAGNOSIS — I10 Essential (primary) hypertension: Secondary | ICD-10-CM | POA: Diagnosis not present

## 2023-10-20 DIAGNOSIS — I25118 Atherosclerotic heart disease of native coronary artery with other forms of angina pectoris: Secondary | ICD-10-CM

## 2023-10-20 DIAGNOSIS — E782 Mixed hyperlipidemia: Secondary | ICD-10-CM | POA: Diagnosis not present

## 2023-10-20 DIAGNOSIS — R0609 Other forms of dyspnea: Secondary | ICD-10-CM | POA: Diagnosis not present

## 2023-10-20 DIAGNOSIS — Z794 Long term (current) use of insulin: Secondary | ICD-10-CM

## 2023-10-20 DIAGNOSIS — R0683 Snoring: Secondary | ICD-10-CM

## 2023-10-20 DIAGNOSIS — E119 Type 2 diabetes mellitus without complications: Secondary | ICD-10-CM

## 2023-10-20 NOTE — Telephone Encounter (Signed)
-----   Message from Nurse Corky Crafts sent at 10/20/2023  1:55 PM EST ----- Regarding: refill of repatha Dr. Rosemary Holms just saw this pt today and he is needing a refill of repatha   Can you assist with this?   Thanks, [employer]

## 2023-10-20 NOTE — Patient Instructions (Signed)
Medication Instructions:   Your physician recommends that you continue on your current medications as directed. Please refer to the Current Medication list given to you today.  *If you need a refill on your cardiac medications before your next appointment, please call your pharmacy*   Lab Work:  TODAY--DOWNSTAIRS FIRST FLOOR AT [facility]--BMET, PRO-BNP, AND CBC  If you have labs (blood work) drawn today and your tests are completely normal, you will receive your results only by: MyChart Message (if you have MyChart) OR A paper copy in the mail If you have any lab test that is abnormal or we need to change your treatment, we will call you to review the results.   Testing/Procedures:  Your physician has recommended that you have a SPLIT NIGHT sleep study. This test records several body functions during sleep, including: brain activity, eye movement, oxygen and carbon dioxide blood levels, heart rate and rhythm, breathing rate and rhythm, the flow of air through your mouth and nose, snoring, body muscle movements, and chest and belly movement.  YOU WILL GET A CALL BACK FROM OUR SLEEP STUDY COORDINATORS (NINA JONES OR LYNN VIA) TO COORDINATE THIS APPOINTMENT, ONCE APPROVED BY THE INSURANCE COMPANY    Your physician has requested that you have an echocardiogram. Echocardiography is a painless test that uses sound waves to create images of your heart. It provides your doctor with information about the size and shape of your heart and how well your heart's chambers and valves are working. This procedure takes approximately one hour. There are no restrictions for this procedure. Please do NOT wear cologne, perfume, aftershave, or lotions (deodorant is allowed). Please arrive 15 minutes prior to your appointment time.  Please note: We ask at that you not bring children with you during ultrasound (echo/ vascular) testing. Due to room size and safety concerns, children are not allowed in the ultrasound  rooms during exams. Our front office staff cannot provide observation of children in our lobby area while testing is being conducted. An adult accompanying a patient to their appointment will only be allowed in the ultrasound room at the discretion of the ultrasound technician under special circumstances. We apologize for any inconvenience.     CARDIAC PET SCAN  Please report to Radiology at the [facility] Main Entrance 30 minutes early for your test.  [address] [place], [place] [address]                      How to Prepare for Your Cardiac PET/CT Stress Test:  Nothing to eat or drink, except water, 3 hours prior to arrival time.  NO caffeine/decaffeinated products, or chocolate 12 hours prior to arrival. (Please note decaffeinated beverages (teas/coffees) still contain caffeine).  If you have caffeine within 12 hours prior, the test will need to be rescheduled.  Medication instructions: Do not take erectile dysfunction medications for 72 hours prior to test (sildenafil, tadalafil) Do not take nitrates (isosorbide mononitrate, Ranexa) the day before or day of test Do not take tamsulosin the day before or morning of test   Diabetic Preparation: If able to eat breakfast prior to 3 hour fasting, you may take all medications, including your insulin. Do not worry if you miss your breakfast dose of insulin - start at your next meal. If you do not eat prior to 3 hour fast-Hold all diabetes (oral and insulin) medications. Patients who wear a continuous glucose monitor MUST remove the device prior to scanning.  You may take your remaining medications with water.  NO perfume, cologne or lotion on chest or abdomen area.   Total time is 1 to 2 hours; you may want to bring reading material for the waiting time.    In preparation for your appointment, medication and supplies will be purchased.  Appointment availability is limited, so if you need to cancel or reschedule,  please call the Radiology Department at [phone] [facility]) OR [phone] [facility]) 24 hours in advance to avoid a cancellation fee of $100.00  What to Expect When you Arrive:  Once you arrive and check in for your appointment, you will be taken to a preparation room within the Radiology Department.  A technologist or Nurse will obtain your medical history, verify that you are correctly prepped for the exam, and explain the procedure.  Afterwards, an IV will be started in your arm and electrodes will be placed on your skin for EKG monitoring during the stress portion of the exam. Then you will be escorted to the PET/CT scanner.  There, staff will get you positioned on the scanner and obtain a blood pressure and EKG.  During the exam, you will continue to be connected to the EKG and blood pressure machines.  A small, safe amount of a radioactive tracer will be injected in your IV to obtain a series of pictures of your heart along with an injection of a stress agent.    After your Exam:  It is recommended that you eat a meal and drink a caffeinated beverage to counter act any effects of the stress agent.  Drink plenty of fluids for the remainder of the day and urinate frequently for the first couple of hours after the exam.  Your doctor will inform you of your test results within 7-10 business days.  For more information and frequently asked questions, please visit our website: [email or web address]  For questions about your test or how to prepare for your test, please call: Cardiac Imaging Nurse Navigators Office: [phone]    Follow-Up:  3 MONTHS WITH DR. PATWARDHAN OR AN EXTENDER IN THE OFFICE

## 2023-10-20 NOTE — Progress Notes (Signed)
Cardiology Office Note:  .   Date:  10/20/2023  ID:  Consuela Mimes, DOB [birth year], MRN [id number] PCP: Hadley Pen, MD  [facility] Providers Cardiologist:  Truett Mainland, MD PCP: Hadley Pen, MD  Chief Complaint  Patient presents with   Coronary artery disease involving native coronary artery of   Dyspnea on exertion   Follow-up    6 months      History of Present Illness: .    HEMANT LAFON is a 68 y.o. male with hypertension, hyperlipidemia, uncontrolled type 2 DM, CAD   Patient continues to have worsening exertional dyspnea symptoms with minimal walking such as walking to his driveway and back.  He denies any specific chest pain symptoms.  He does not recollect if he had clear chest pain symptoms prior to his last PCI or not.  Symptoms have persisted in spite of recent intentional weight loss with dietary changes, off about 12 pounds.  Separately, he was diagnosed with OSA around her back, but has not consistently use CPAP machine.    Vitals:   10/20/23 1324  BP: 114/66  Pulse: 91  Resp: 16  SpO2: 98%     ROS:  Review of Systems  Constitutional: Positive for malaise/fatigue.  Cardiovascular:  Positive for dyspnea on exertion. Negative for chest pain, leg swelling, palpitations and syncope.  Respiratory:  Positive for snoring.      Studies Reviewed: Marland Kitchen       EKG 10/20/2023: Normal sinus rhythm Low voltage QRS When compared with ECG of 29-Dec-2021 06:32, No significant change was found     Independently interpreted 08/26/2022: Glucose 137, BUN/Cr 24/1.37. EGFR 62. Na/K 140/4.8. Rest of the CMP normal H/H 12/39. MCV 91. Platelets 198 HbA1C 7.4% Chol 95, TG 136, HDL 47, LDL 36                                                                                                  Echocardiogram 02/14/2023: Normal LV systolic function with visual EF 60-65%. Left ventricle cavity is normal in size. Normal global wall motion. Normal  diastolic filling pattern, normal LAP. Calculated EF 65%. Structurally normal tricuspid valve with trace regurgitation. No evidence of pulmonary hypertension. No prior available for comparison.   Exercise nuclear stress test 02/02/2023: Myocardial perfusion is abnormal. Significant soft tissue attenuation is present however inferior ischemia cannot be definitively ruled out. Overall LV systolic function is normal without regional wall motion abnormalities. Stress LV EF: 64%. Low risk study. Nondiagnostic ECG stress due to target HR not achieved. The heart rate response was normal. The blood pressure response was normal. No previous exam available for comparison.  Coronary intervention 12/2021: LM: Normal LAD: Mid 80% stenosis. Adjacent diag with 40% prox disease Lcx: No significant disease RCA: Large, tortuous. No significant disease     Successful IVUS guided percutaneous coronary intervention mid LAD        PTCA and stent placement 2.75 X 15 mm Onyx Frontier drug-eluting stent        Post dilatation using 3.25X8 mm [place] balloon at 18 atm  Physical Exam:   Physical Exam Vitals and nursing note reviewed.  Constitutional:      General: He is not in acute distress.    Appearance: He is obese.  Neck:     Vascular: No JVD.  Cardiovascular:     Rate and Rhythm: Normal rate and regular rhythm.     Heart sounds: Normal heart sounds. No murmur heard. Pulmonary:     Effort: Pulmonary effort is normal.     Breath sounds: Normal breath sounds. No wheezing or rales.      VISIT DIAGNOSES:   ICD-10-CM   1. 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 Essential hypertension  I10     3. Mixed hyperlipidemia  E78.2     4. Dyspnea on exertion  R06.09        ASSESSMENT AND PLAN: .    JHOEL ARTHER is a 68 y.o. male with hypertension, hyperlipidemia, type 2 diabetes mellitus, CAD     CAD: Exertional dyspnea but no specific chest pain. He did have chest pain prior to PCI in 12/2021. Nuclear stress test (02/2023) with possible inferior attenuation but no large area of ischemia. No significant echocardiogram abnormalities to explain dyspnea (02/2023). Obesity very likely contributing. He did not tolerate SGLT1i in the past. He does not want consider surgical weight loss. Given persistent concern for CAD as cause of his symptoms, I will repeat echocardiogram, and obtain PET/CT stress test. Continue Aspirin, statin, Repatha.   Fatigue: Known OSA, not consistently on CPAP. Will repeat sleep study and reassess for CPAP prescription.  Mixed hyperlipidemia: As above.   Hypertension: Well-controlled  Informed Consent   Shared Decision Making/Informed Consent The risks [chest pain, shortness of breath, cardiac arrhythmias, dizziness, blood pressure fluctuations, myocardial infarction, stroke/transient ischemic attack, nausea, vomiting, allergic reaction, radiation exposure, metallic taste sensation and life-threatening complications (estimated to be 1 in 10,000)], benefits (risk stratification, diagnosing coronary artery disease, treatment guidance) and alternatives of a cardiac PET stress test were discussed in detail with Mr. Aarons and he agrees to proceed.      F/u in 3 months  Signed, Elder Negus, MD

## 2023-10-20 NOTE — Telephone Encounter (Signed)
Tajai, Lennartz - 10/20/2023  2:08 PM Cheree Ditto, [facility]  Sent: Caleen Essex October 20, 2023  2:09 PM  To: Loa Socks, LPN         Message  ----- Message from Cheree Ditto, [facility] sent at 10/20/2023  2:09 PM EST -----  Was going to refill but I see he was prescribed a year supply last August. Recommend he contact his pharmacy    Discussed the above message with the pt at checkout.  He will follow-up with his pharmacy.

## 2023-10-21 LAB — CBC
Hematocrit: 41.9 % (ref 37.5–51.0)
Hemoglobin: 13.8 g/dL (ref 13.0–17.7)
MCH: 29.3 pg (ref 26.6–33.0)
MCHC: 32.9 g/dL (ref 31.5–35.7)
MCV: 89 fL (ref 79–97)
Platelets: 279 10*3/uL (ref 150–450)
RBC: 4.71 x10E6/uL (ref 4.14–5.80)
RDW: 13.4 % (ref 11.6–15.4)
WBC: 7.3 10*3/uL (ref 3.4–10.8)

## 2023-10-21 LAB — BASIC METABOLIC PANEL
BUN/Creatinine Ratio: 20 (ref 10–24)
BUN: 29 mg/dL — ABNORMAL HIGH (ref 8–27)
CO2: 22 mmol/L (ref 20–29)
Calcium: 10.2 mg/dL (ref 8.6–10.2)
Chloride: 101 mmol/L (ref 96–106)
Creatinine, Ser: 1.42 mg/dL — ABNORMAL HIGH (ref 0.76–1.27)
Glucose: 120 mg/dL — ABNORMAL HIGH (ref 70–99)
Potassium: 4.7 mmol/L (ref 3.5–5.2)
Sodium: 141 mmol/L (ref 134–144)
eGFR: 54 mL/min/{1.73_m2} — ABNORMAL LOW (ref >59)

## 2023-10-21 LAB — PRO B NATRIURETIC PEPTIDE: NT-Pro BNP: 66 pg/mL (ref 0–376)

## 2023-10-24 NOTE — Progress Notes (Signed)
Mild increase in creatinine.  Recommend liberal hydration. Repeat BMP in 2 weeks.  Thanks MJP

## 2023-10-25 ENCOUNTER — Telehealth: Payer: Self-pay | Admitting: *Deleted

## 2023-10-25 ENCOUNTER — Telehealth: Payer: Self-pay | Admitting: Cardiology

## 2023-10-25 DIAGNOSIS — Z79899 Other long term (current) drug therapy: Secondary | ICD-10-CM

## 2023-10-25 DIAGNOSIS — R7989 Other specified abnormal findings of blood chemistry: Secondary | ICD-10-CM

## 2023-10-25 NOTE — Telephone Encounter (Signed)
Prior Authorization for SPLIT NIGHT sent to [facility] via web portal. Tracking Number . Outpatient Authorization [id number] Decision: Approved-Service Dates:10/25/2023 - 01/23/2024

## 2023-10-25 NOTE — Telephone Encounter (Signed)
Returned a call back to the pt and informed him that per Dr. Rosemary Holms, he can also hold his lasix for 2 weeks, increase his po water intake, and come in for repeat BMET on 11/08/23, as previously discussed earlier on the phone.  This is all for elevated renal function on recent lab.  Hold note on his lasix for 2 weeks placed.   He will repeat BMET on 11/08/23.  Pt verbalized understanding and agrees with this plan.

## 2023-10-25 NOTE — Telephone Encounter (Signed)
The patient has been notified of the result and verbalized understanding.  All questions (if any) were answered.  Pt aware to increase his po water intake and we will repeat his BMET in 2 weeks (around 11/08/23).    Pt aware to come around that time for repeat bmet and I will place the order and release for that time.  Pt verbalized understanding and agrees with this plan.

## 2023-10-25 NOTE — Telephone Encounter (Signed)
Dr. Rosemary Holms, gave pt his results over the phone and advised about increasing hydration and repeat bmet in 2 weeks.   He called back shortly after giving him the results and is inquiring if he should stop lasix during that time, to help with elevated renal function.   Please advise.

## 2023-10-25 NOTE — Telephone Encounter (Signed)
I think that is a good idea. Agree.  Thanks MJP

## 2023-10-25 NOTE — Telephone Encounter (Signed)
Pt c/o medication issue:  1. Name of Medication:   furosemide (LASIX) 40 MG tablet   2. How are you currently taking this medication (dosage and times per day)?   3. Are you having a reaction (difficulty breathing--STAT)?   4. What is your medication issue?    Patient stated he returned staff call and wanted to report that he has been taking a fluid pill (furosemide (LASIX) 40 MG tablet).  Patient stated that he is going to hold this medication as he has been dehydrated.

## 2023-10-25 NOTE — Telephone Encounter (Signed)
-----   Message from Nurse Estelle Grumbles sent at 10/24/2023  5:10 PM EST -----  ----- Message ----- From: Elder Negus, MD Sent: 10/24/2023  11:26 AM EST To: [facility] Triage  Mild increase in creatinine.  Recommend liberal hydration. Repeat BMP in 2 weeks.  Thanks MJP

## 2023-11-01 ENCOUNTER — Telehealth: Payer: Self-pay

## 2023-11-01 ENCOUNTER — Other Ambulatory Visit (HOSPITAL_COMMUNITY): Payer: Self-pay

## 2023-11-01 NOTE — Telephone Encounter (Signed)
Pharmacy Patient Advocate Encounter   Received notification from CoverMyMeds that prior authorization for REPATHA is required/requested.   Insurance verification completed.   The patient is insured through [facility] [employer] .   Per test claim: Refill too soon. PA is not needed at this time. Medication was filled 10/27/23. Next eligible fill date is 11/17/23. This test claim was processed through [facility]- copay amounts may vary at other pharmacies due to pharmacy/plan contracts, or as the patient moves through the different stages of their insurance plan.

## 2023-11-03 LAB — BASIC METABOLIC PANEL
BUN/Creatinine Ratio: 19 (ref 10–24)
BUN: 28 mg/dL — ABNORMAL HIGH (ref 8–27)
CO2: 21 mmol/L (ref 20–29)
Calcium: 10.3 mg/dL — ABNORMAL HIGH (ref 8.6–10.2)
Chloride: 100 mmol/L (ref 96–106)
Creatinine, Ser: 1.46 mg/dL — ABNORMAL HIGH (ref 0.76–1.27)
Glucose: 133 mg/dL — ABNORMAL HIGH (ref 70–99)
Potassium: 5.1 mmol/L (ref 3.5–5.2)
Sodium: 140 mmol/L (ref 134–144)
eGFR: 52 mL/min/{1.73_m2} — ABNORMAL LOW (ref >59)

## 2023-11-07 ENCOUNTER — Encounter: Payer: Self-pay | Admitting: *Deleted

## 2023-11-13 ENCOUNTER — Telehealth: Payer: Self-pay | Admitting: *Deleted

## 2023-11-13 NOTE — Telephone Encounter (Signed)
-----   Message from Nurse Ardelia Beau E sent at 11/13/2023  4:40 PM EST ----- Regarding: RE: split night sleep study per Dr. Filiberto Hug Split night scheduled for 11/17/23. ----- Message ----- From: Peggi Bowels, LPN Sent: 0/45/[id number]   1:52 PM EST To: Joslyn Nim, CMA; Isabella Mao Via, LPN; # Subject: split night sleep study per Dr. Filiberto Hug     Dr. Filiberto Hug wants this pt to have a split night sleep study only for morbid obesity and snoring  Order is in  Pt aware you will call to coordinate when appropriate   Can you please call and schedule and shoot me the date thereafter?   Thanks [employer]

## 2023-11-14 ENCOUNTER — Ambulatory Visit (HOSPITAL_COMMUNITY): Payer: PPO | Attending: Cardiology

## 2023-11-14 DIAGNOSIS — R0609 Other forms of dyspnea: Secondary | ICD-10-CM | POA: Diagnosis present

## 2023-11-14 LAB — ECHOCARDIOGRAM COMPLETE
Area-P 1/2: 3.5 cm2
S' Lateral: 3 cm

## 2023-11-14 MED ORDER — PERFLUTREN LIPID MICROSPHERE
1.0000 mL | INTRAVENOUS | Status: AC | PRN
  Administered 2023-11-14: 2 mL via INTRAVENOUS

## 2023-11-15 NOTE — Progress Notes (Signed)
Normal pumping function of the heart. No severe heart valve abnormalities noted.  Small amount of fluid around the heart, likely clinically nonsignificant.  Thanks MJP

## 2023-11-17 ENCOUNTER — Ambulatory Visit (HOSPITAL_BASED_OUTPATIENT_CLINIC_OR_DEPARTMENT_OTHER): Payer: PPO | Attending: Cardiology | Admitting: Cardiology

## 2023-11-17 VITALS — Ht 65.0 in | Wt 257.0 lb

## 2023-11-17 DIAGNOSIS — G4736 Sleep related hypoventilation in conditions classified elsewhere: Secondary | ICD-10-CM | POA: Insufficient documentation

## 2023-11-17 DIAGNOSIS — R0683 Snoring: Secondary | ICD-10-CM

## 2023-11-17 DIAGNOSIS — G4733 Obstructive sleep apnea (adult) (pediatric): Secondary | ICD-10-CM | POA: Diagnosis not present

## 2023-11-17 DIAGNOSIS — Z6841 Body Mass Index (BMI) 40.0 and over, adult: Secondary | ICD-10-CM | POA: Insufficient documentation

## 2023-11-24 ENCOUNTER — Other Ambulatory Visit: Payer: Self-pay | Admitting: Cardiology

## 2023-11-26 ENCOUNTER — Other Ambulatory Visit: Payer: Self-pay | Admitting: Cardiology

## 2023-12-05 NOTE — Progress Notes (Deleted)
Error

## 2023-12-08 ENCOUNTER — Encounter (HOSPITAL_COMMUNITY): Payer: Self-pay

## 2023-12-11 NOTE — Procedures (Signed)
   [facility] [address] [place], [place] [address] Tel: [phone]   Fax: [phone]  Polysomnography Interpretation  Patient Name:  Gary Jimenez, Gary Jimenez Date:  11/17/2023 Referring Physician:  Felix Pacini  Indications for Polysomnography The patient is a 68 year-old Male who is 5\' 5"  and weighs 257.0 lbs. His BMI equals 42.8.  A full night polysomnogram was performed to evaluate for Obstructive Sleep Apnea.  Polysomnogram Data A full night polysomnogram recorded the standard physiologic parameters including EEG, EOG, EMG, EKG, nasal and oral airflow.  Respiratory parameters of chest and abdominal movements were recorded with Respiratory Inductance Plethysmography belts.  Oxygen saturation was recorded by pulse oximetry.   Sleep Architecture The total recording time of the polysomnogram was 375.1 minutes.  The total sleep time was 210.5 minutes.  The patient spent 5.9% of total sleep time in Stage N1, 94.1% in Stage N2, 0.0% in Stages N3, and 0.0% in REM.  Sleep latency was 35.2 minutes.  REM latency was - minutes.  Sleep Efficiency was 56.1%.  Wake after Sleep Onset time was 129.5 minutes.  Respiratory Events The polysomnogram revealed a presence of 11 obstructive, - central, and - mixed apneas resulting in an Apnea index of 3.1 events per hour.  There were 150 hypopneas (>=3% desat and/or Ar.) resulting in an Apnea\Hypopnea Index (AHI >=3% desat and/or Ar.) of 45.9 events per hour.  There were 81 hypopneas (>=4% desat) resulting in an Apnea\Hypopnea Index (AHI >=4% desat) of 26.2 events per hour.  There were - Respiratory Effort Related Arousals resulting in a RERA index of - events per hour. The Respiratory Disturbance Index is 45.9 events per hour.   Mean oxygen saturation was 91.7%.  The lowest oxygen saturation during sleep was 86.0%.  Time spent <=88% oxygen saturation was 22.1 minutes (6.0%).  Limb Activity There were 0 total  limb movements recorded, of this total, 0 were classified as PLMs.  PLM index was 0 per hour and PLM associated with Arousals index was 0 per hour.  Cardiac Summary The average pulse rate was 70.3 bpm.  The minimum pulse rate was 60.0 bpm while the maximum pulse rate was 94.0 bpm.  Cardiac rhythm was normal/abnormal.  Diagnosis:  Moderate Obstructive Sleep Apnea Nocturnal Hypoxemia  Recommendations: Recommend in lab CPAP titration for the treatment of sleep disordered breathing. The patient should be counseled in good sleep hygiene and avoid sleeping supine. The patient should be counseled to avoid driving while sleepy.  Electronically signed by:  Armanda Magic, MD

## 2023-12-12 ENCOUNTER — Encounter (HOSPITAL_COMMUNITY)
Admission: RE | Admit: 2023-12-12 | Discharge: 2023-12-12 | Disposition: A | Discharge status: Home / Self Care | Payer: HMO | Source: Ambulatory Visit | Attending: Cardiology | Admitting: Cardiology

## 2023-12-12 ENCOUNTER — Encounter: Payer: Self-pay | Admitting: Cardiology

## 2023-12-12 DIAGNOSIS — R0609 Other forms of dyspnea: Secondary | ICD-10-CM | POA: Diagnosis not present

## 2023-12-12 DIAGNOSIS — I25118 Atherosclerotic heart disease of native coronary artery with other forms of angina pectoris: Secondary | ICD-10-CM | POA: Diagnosis present

## 2023-12-12 LAB — NM PET CT CARDIAC PERFUSION MULTI W/ABSOLUTE BLOODFLOW
LV dias vol: 76 mL (ref 62–150)
LV sys vol: 30 mL
MBFR: 2.3
Nuc Rest EF: 61 %
Nuc Stress EF: 68 %
Rest MBF: 0.81 ml/g/min
Rest Nuclear Isotope Dose: 29.9 mCi
ST Depression (mm): 0 mm
Stress MBF: 1.86 ml/g/min
Stress Nuclear Isotope Dose: 29.9 mCi

## 2023-12-12 MED ORDER — REGADENOSON 0.4 MG/5ML IV SOLN
INTRAVENOUS | Status: AC
  Filled 2023-12-12: qty 5

## 2023-12-12 MED ORDER — RUBIDIUM RB82 GENERATOR (RUBYFILL)
29.9000 | PACK | Freq: Once | INTRAVENOUS | Status: AC
  Administered 2023-12-12: 29.9 via INTRAVENOUS

## 2023-12-12 MED ORDER — REGADENOSON 0.4 MG/5ML IV SOLN
0.4000 mg | Freq: Once | INTRAVENOUS | Status: AC
  Administered 2023-12-12: 0.4 mg via INTRAVENOUS

## 2023-12-12 NOTE — Progress Notes (Signed)
Tolerated Lexiscan well.

## 2023-12-14 ENCOUNTER — Telehealth: Payer: Self-pay

## 2023-12-14 DIAGNOSIS — R0683 Snoring: Secondary | ICD-10-CM

## 2023-12-14 DIAGNOSIS — E119 Type 2 diabetes mellitus without complications: Secondary | ICD-10-CM

## 2023-12-14 DIAGNOSIS — E782 Mixed hyperlipidemia: Secondary | ICD-10-CM

## 2023-12-14 DIAGNOSIS — I25118 Atherosclerotic heart disease of native coronary artery with other forms of angina pectoris: Secondary | ICD-10-CM

## 2023-12-14 DIAGNOSIS — R0609 Other forms of dyspnea: Secondary | ICD-10-CM

## 2023-12-14 DIAGNOSIS — I10 Essential (primary) hypertension: Secondary | ICD-10-CM

## 2023-12-14 DIAGNOSIS — G4733 Obstructive sleep apnea (adult) (pediatric): Secondary | ICD-10-CM

## 2023-12-14 NOTE — Telephone Encounter (Signed)
 Notified patient of sleep study rsults and recommendations. All question wee answered and patient verbalized understanding. Patient request a sleep aid for scheduled CPAP Titration ordered today.

## 2023-12-14 NOTE — Telephone Encounter (Signed)
-----   Message from Armanda Magic sent at 12/11/2023  4:38 PM EDT ----- Please let patient know that they have sleep apnea.  Recommend therapeutic CPAP titration for treatment of patient's sleep disordered breathing.

## 2023-12-15 ENCOUNTER — Other Ambulatory Visit: Payer: Self-pay

## 2023-12-15 MED ORDER — ESZOPICLONE 2 MG PO TABS: 2.0000 mg | ORAL_TABLET | Freq: Every evening | ORAL | 0 refills | Status: DC | PRN

## 2023-12-15 MED ORDER — ESZOPICLONE 2 MG PO TABS: 2.0000 mg | ORAL_TABLET | Freq: Every evening | ORAL | 0 refills | Status: AC | PRN

## 2023-12-15 NOTE — Progress Notes (Signed)
 Order for Lunesta 2MG  to take at bedtime before Sleep Study Titration

## 2023-12-24 ENCOUNTER — Other Ambulatory Visit: Payer: Self-pay | Admitting: Cardiology

## 2024-01-08 ENCOUNTER — Ambulatory Visit: Payer: HMO | Attending: Cardiology | Admitting: Cardiology

## 2024-01-08 ENCOUNTER — Encounter: Payer: Self-pay | Admitting: Cardiology

## 2024-01-08 VITALS — BP 118/78 | HR 98 | Resp 16 | Ht 65.0 in | Wt 264.0 lb

## 2024-01-08 DIAGNOSIS — E66813 Obesity, class 3: Secondary | ICD-10-CM | POA: Diagnosis not present

## 2024-01-08 DIAGNOSIS — R0609 Other forms of dyspnea: Secondary | ICD-10-CM

## 2024-01-08 DIAGNOSIS — I2510 Atherosclerotic heart disease of native coronary artery without angina pectoris: Secondary | ICD-10-CM | POA: Diagnosis not present

## 2024-01-08 DIAGNOSIS — Z6841 Body Mass Index (BMI) 40.0 and over, adult: Secondary | ICD-10-CM

## 2024-01-08 NOTE — Progress Notes (Signed)
 Cardiology Office Note:  .   Date:  01/08/2024  ID:  Gary Jimenez, DOB [birth year], MRN [id number] PCP: Hadley Pen, MD  [facility] Providers Cardiologist:  Truett Mainland, MD PCP: Hadley Pen, MD  Chief Complaint  Patient presents with   Coronary artery disease involving native coronary artery of   Follow-up      History of Present Illness: .    Gary Jimenez is a 68 y.o. male with hypertension, hyperlipidemia, uncontrolled type 2 DM, CAD   Patient continues to have worsening exertional dyspnea symptoms with minimal walking such as walking to his driveway and back.  Reviewed recent echocardiogram and PET/CT results with the patient, details below.    Vitals:   01/08/24 1423  BP: 118/78  Pulse: 98  Resp: 16  SpO2: 94%      ROS:  Review of Systems  Constitutional: Positive for malaise/fatigue.  Cardiovascular:  Positive for dyspnea on exertion. Negative for chest pain, leg swelling, palpitations and syncope.  Respiratory:  Positive for snoring.      Studies Reviewed: Marland Kitchen       EKG 10/20/2023: Normal sinus rhythm Low voltage QRS When compared with ECG of 29-Dec-2021 06:32, No significant change was found     Independently interpreted 08/26/2022: Glucose 137, BUN/Cr 24/1.37. EGFR 62. Na/K 140/4.8. Rest of the CMP normal H/H 12/39. MCV 91. Platelets 198 HbA1C 7.4% Chol 95, TG 136, HDL 47, LDL 36                                                                                                  PET/CT stress test 12/2023:   LV perfusion is normal. There is no evidence of ischemia. There is no evidence of infarction.   Rest left ventricular function is normal. Rest EF: 61%. Stress left ventricular function is normal. Stress EF: 68%. End diastolic cavity size is normal. End systolic cavity size is normal.   Myocardial blood flow was computed to be 0.73ml/g/min at rest and 1.46ml/g/min at stress. Global myocardial blood flow reserve was 2.30  and was normal.   Calcium not commented on due to prior LAD stent   The study is normal. The study is low risk.  Echocardiogram 11/13/2022:  1. Left ventricular ejection fraction, by estimation, is 60 to 65%. The  left ventricle has normal function. The left ventricle has no regional  wall motion abnormalities. Left ventricular diastolic parameters are  consistent with Grade I diastolic dysfunction (impaired relaxation).   2. Right ventricular systolic function is normal. The right ventricular  size is normal. Tricuspid regurgitation signal is inadequate for assessing  PA pressure.   3. A small pericardial effusion is present.   4. The mitral valve is normal in structure. No evidence of mitral valve  regurgitation.   5. The aortic valve was not well visualized. Aortic valve regurgitation  is not visualized.   6. There is moderate dilatation of the aortic root, measuring 39 mm.     Exercise nuclear stress test 02/02/2023: Myocardial perfusion is abnormal. Significant soft tissue attenuation is present however inferior  ischemia cannot be definitively ruled out. Overall LV systolic function is normal without regional wall motion abnormalities. Stress LV EF: 64%. Low risk study. Nondiagnostic ECG stress due to target HR not achieved. The heart rate response was normal. The blood pressure response was normal. No previous exam available for comparison.  Coronary intervention 12/2021: LM: Normal LAD: Mid 80% stenosis. Adjacent diag with 40% prox disease Lcx: No significant disease RCA: Large, tortuous. No significant disease     Successful IVUS guided percutaneous coronary intervention mid LAD        PTCA and stent placement 2.75 X 15 mm Onyx Frontier drug-eluting stent        Post dilatation using 3.25X8 mm Govan balloon at 18 atm     Physical Exam:   Physical Exam Vitals and nursing note reviewed.  Constitutional:      General: He is not in acute distress.    Appearance: He is obese.   Neck:     Vascular: No JVD.  Cardiovascular:     Rate and Rhythm: Normal rate and regular rhythm.     Heart sounds: Normal heart sounds. No murmur heard. Pulmonary:     Effort: Pulmonary effort is normal.     Breath sounds: Normal breath sounds. No wheezing or rales.      VISIT DIAGNOSES:   ICD-10-CM   1. Coronary artery disease involving native coronary artery of native heart without angina pectoris  I25.10     2. Exertional dyspnea  R06.09     3. Class 3 severe obesity due to excess calories with serious comorbidity and body mass index (BMI) of 40.0 to 44.9 in adult [facility])  Z61.096    E66.01    Z68.41         ASSESSMENT AND PLAN: .    Gary Jimenez is a 68 y.o. male with hypertension, hyperlipidemia, type 2 diabetes mellitus, CAD, OSA  Exertional dyspnea: PET/CT stress test with no ischemia.  Echocardiogram with small pericardial effusion.  I do not think his symptoms are related to any cardiac etiology.  I do strongly think that his symptoms are probably related to obesity.  OSA also likely contributing.  Testing is pending.  Recommend follow-up with dietitian.  I strongly recommend calorie negative diet.  He is not interested in GLP-1 agonist or surgical weight loss at this time.   CAD: No ischemia on PET/CT (2025). Continue Aspirin, statin, Repatha.   OSA: Needs CPAP titration.  Mixed hyperlipidemia: As above.   Hypertension: Well-controlled  F/u in 1 year  Signed, Elder Negus, MD

## 2024-01-08 NOTE — Patient Instructions (Signed)
 Follow-Up: At [facility], you and your health needs are our priority.  As part of our continuing mission to provide you with exceptional heart care, our providers are all part of one team.  This team includes your primary Cardiologist (physician) and Advanced Practice Providers or APPs (Physician Assistants and Nurse Practitioners) who all work together to provide you with the care you need, when you need it.  Your next appointment:   1 year(s)  Provider:   Elder Negus, MD     Other Instructions      1st Floor: - Lobby - Registration  - Pharmacy  - Lab - Cafe  2nd Floor: - PV Lab - Diagnostic Testing (echo, CT, nuclear med)  3rd Floor: - Vacant  4th Floor: - TCTS (cardiothoracic surgery) - AFib Clinic - Structural Heart Clinic - Vascular Surgery  - Vascular Ultrasound  5th Floor: - HeartCare Cardiology (general and EP) - Clinical Pharmacy for coumadin, hypertension, lipid, weight-loss medications, and med management appointments    Valet parking services will be available as well.

## 2024-02-28 ENCOUNTER — Other Ambulatory Visit: Payer: Self-pay | Admitting: Cardiology

## 2024-02-28 DIAGNOSIS — E119 Type 2 diabetes mellitus without complications: Secondary | ICD-10-CM

## 2024-04-12 ENCOUNTER — Telehealth: Payer: Self-pay

## 2024-04-12 NOTE — Telephone Encounter (Addendum)
**Note De-Identified Modesta Sammons Obfuscation** I did a CPAP Titration PA through the [employer] provider portal and it has been approved. Valid dates: 04/09/2024-07/08/2024  Authorization #: [id number].  I have transferred the order to the sleep lab.

## 2024-05-26 ENCOUNTER — Ambulatory Visit (HOSPITAL_BASED_OUTPATIENT_CLINIC_OR_DEPARTMENT_OTHER): Attending: Cardiology | Admitting: Cardiology

## 2024-05-26 DIAGNOSIS — R0609 Other forms of dyspnea: Secondary | ICD-10-CM | POA: Diagnosis not present

## 2024-05-26 DIAGNOSIS — G4733 Obstructive sleep apnea (adult) (pediatric): Secondary | ICD-10-CM | POA: Insufficient documentation

## 2024-05-26 DIAGNOSIS — E782 Mixed hyperlipidemia: Secondary | ICD-10-CM | POA: Insufficient documentation

## 2024-05-26 DIAGNOSIS — I10 Essential (primary) hypertension: Secondary | ICD-10-CM | POA: Insufficient documentation

## 2024-05-26 DIAGNOSIS — R0683 Snoring: Secondary | ICD-10-CM

## 2024-05-26 DIAGNOSIS — I25118 Atherosclerotic heart disease of native coronary artery with other forms of angina pectoris: Secondary | ICD-10-CM | POA: Diagnosis not present

## 2024-05-26 DIAGNOSIS — Z794 Long term (current) use of insulin: Secondary | ICD-10-CM | POA: Insufficient documentation

## 2024-05-26 DIAGNOSIS — Z6841 Body Mass Index (BMI) 40.0 and over, adult: Secondary | ICD-10-CM | POA: Insufficient documentation

## 2024-05-26 DIAGNOSIS — E119 Type 2 diabetes mellitus without complications: Secondary | ICD-10-CM | POA: Insufficient documentation

## 2024-05-28 NOTE — Procedures (Signed)
  Indications for Polysomnography The patient is a 68 year-old Male who is 5' 5 and weighs 257.0 lbs. His BMI equals 42.8.  A full night titration treatment study was performed.  MedicationLunesta Polysomnogram Data A full night polysomnogram recorded the standard physiologic parameters including EEG, EOG, EMG, EKG, nasal and oral airflow.  Respiratory parameters of chest and abdominal movements were recorded with Respiratory Inductance Plethysmography belts.   Oxygen saturation was recorded by pulse oximetry.  Sleep Architecture The total recording time of the polysomnogram was 450.9 minutes.  The total sleep time was 340.5 minutes.  The patient spent 20.7% of total sleep time in Stage N1, 57.3% in Stage N2, 6.2% in Stages N3, and 15.9% in REM.  Sleep latency was 5.7 minutes.   REM latency was 201.0 minutes.  Sleep Efficiency was 75.5%.  Wake after Sleep Onset time was 105.0 minutes.  Titration Summary The patient was titrated at pressures ranging from 12/3 cm/H20 up to 16/10 cm/H20. The last pressure used in the study was 15/8 cm/H20.  Respiratory Events The polysomnogram revealed a presence of 2 obstructive, 3 central, and 0 mixed apneas resulting in an Apnea index of 0.9 events per hour.  There were 53 hypopneas (GreaterEqual to3% desaturation and/or arousal) resulting in an Apnea\Hypopnea Index (AHI  GreaterEqual to3% desaturation and/or arousal) of 10.2 events per hour.  There were 46 hypopneas (GreaterEqual to4% desaturation) resulting in an Apnea\Hypopnea Index (AHI GreaterEqual to4% desaturation) of 9.0 events per hour.  There were 57 Respiratory  Effort Related Arousals resulting in a RERA index of 10.0 events per hour. The Respiratory Disturbance Index is 20.3 events per hour.  The snore index was 0 events per hour.  Mean oxygen saturation was 90.7%.  The lowest oxygen saturation during sleep was 85.0%.  Time spent LessEqual to88% oxygen saturation was  minutes ().  Limb  Activity There were 374 limb movements recorded.  Of this total, 367 were classified as PLMs.  Of the PLMs, 107 were associated with arousals.  The Limb Movement index was 65.9 per hour while the PLM index was 64.7 per hour.  Cardiac Summary The average pulse rate was 67.2 bpm.  The minimum pulse rate was 57.0 bpm while the maximum pulse rate was 132.0 bpm.  Cardiac rhythm was normal.  Diagnosis: Obstructive Sleep Apnea  Recommendations: 1. Recommend a trial of ResMed CPAP at 15cm H2O with EPR 2, heated humidity and large Airfit P10 pillow mask with chin strap. 2. The patient should be encouraged to practice good sleep hygiene and avoid sleeping in the supine position. 3. Encourage patient to avoid driving when sleepy. 4. Followup in Sleep Medicine Clinic in 6 weeks.   This study was personally reviewed and electronically signed by: Shlomo Corning Accredited Board Certified in Sleep Medicine Date/Time: 05/28/2024  1:56PM

## 2024-06-21 ENCOUNTER — Telehealth: Payer: Self-pay | Admitting: *Deleted

## 2024-06-21 NOTE — Telephone Encounter (Signed)
 The patient has been notified of the result and verbalized understanding.  All questions (if any) were answered. Joshua Dalton Seip, CMA 06/21/2024 12:15 PM     Upon patient request DME selection is [facility] Patient understands he will be contacted by [facility] to set up his cpap. Patient understands to call if [facility] does not contact him with new setup in a timely manner. Patient understands they will be called once confirmation has been received from [facility] that they have received their new machine to schedule 10 week follow up appointment.   [facility] notified of new cpap order  Please add to airview Patient was grateful for the call and thanked me.

## 2024-06-21 NOTE — Telephone Encounter (Signed)
-----   Message from Gary Jimenez sent at 05/28/2024  1:57 PM EDT ----- Please let patient know that they had a successful PAP titration and let DME know that orders are in EPIC.  Please set up 6 week OV with me.

## 2024-08-01 ENCOUNTER — Other Ambulatory Visit: Payer: Self-pay | Admitting: Cardiology

## 2024-08-01 DIAGNOSIS — E782 Mixed hyperlipidemia: Secondary | ICD-10-CM

## 2024-08-01 DIAGNOSIS — I25118 Atherosclerotic heart disease of native coronary artery with other forms of angina pectoris: Secondary | ICD-10-CM

## 2024-08-06 ENCOUNTER — Ambulatory Visit: Payer: Self-pay | Admitting: Cardiology

## 2024-08-16 NOTE — Telephone Encounter (Signed)
 Good AHI and compliance. Continue current PAP settings.    The patient has been notified of the result and verbalized understanding.  All questions (if any) were answered. Joshua Dalton Seip, CMA 08/16/2024 9:32 AM      Patient understands his AHI showed normal. Pt is aware and agreeable to normal results.

## 2024-08-21 NOTE — Progress Notes (Signed)
 Cardiology Office Note:  .   Date:  08/21/2024  ID:  Gary Jimenez, DOB Nov 05, 1955, MRN [id number] PCP: Silver Lamar LABOR, MD  [facility] Providers Cardiologist:  Newman Lawrence, MD PCP: Silver Lamar LABOR, MD  Chief Complaint  Patient presents with   Coronary Artery Disease      History of Present Illness: .    Gary Jimenez is a 68 y.o. male with hypertension, hyperlipidemia, uncontrolled type 2 DM, CAD   Patient is doing well from cardiac standpoint. Denies any chest pain, more than usual dyspnea symptoms. He continues to struggle with using CPAP for OSA. He is following up with Dr Shlomo for OSA management. Blood pressure is well controlled.    Vitals:   08/22/24 1425  BP: 104/65  Pulse: 78  SpO2: 95%       ROS:  Review of Systems  Constitutional: Negative for malaise/fatigue.  Cardiovascular:  Negative for chest pain, dyspnea on exertion, leg swelling, palpitations and syncope.  Respiratory:  Positive for snoring.      Studies Reviewed: Gary Jimenez       EKG 10/20/2023: Normal sinus rhythm Low voltage QRS When compared with ECG of 29-Dec-2021 06:32, No significant change was found                                                                                          PET/CT stress test 12/2023:   LV perfusion is normal. There is no evidence of ischemia. There is no evidence of infarction.   Rest left ventricular function is normal. Rest EF: 61%. Stress left ventricular function is normal. Stress EF: 68%. End diastolic cavity size is normal. End systolic cavity size is normal.   Myocardial blood flow was computed to be 0.56ml/g/min at rest and 1.86ml/g/min at stress. Global myocardial blood flow reserve was 2.30 and was normal.   Calcium  not commented on due to prior LAD stent   The study is normal. The study is low risk.  Echocardiogram 11/13/2022:  1. Left ventricular ejection fraction, by estimation, is 60 to 65%. The  left ventricle has normal  function. The left ventricle has no regional  wall motion abnormalities. Left ventricular diastolic parameters are  consistent with Grade I diastolic dysfunction (impaired relaxation).   2. Right ventricular systolic function is normal. The right ventricular  size is normal. Tricuspid regurgitation signal is inadequate for assessing  PA pressure.   3. A small pericardial effusion is present.   4. The mitral valve is normal in structure. No evidence of mitral valve  regurgitation.   5. The aortic valve was not well visualized. Aortic valve regurgitation  is not visualized.   6. There is moderate dilatation of the aortic root, measuring 39 mm.    Exercise nuclear stress test 02/02/2023: Myocardial perfusion is abnormal. Significant soft tissue attenuation is present however inferior ischemia cannot be definitively ruled out. Overall LV systolic function is normal without regional wall motion abnormalities. Stress LV EF: 64%. Low risk study. Nondiagnostic ECG stress due to target HR not achieved. The heart rate response was normal. The blood pressure response was normal. No previous exam  available for comparison.  Coronary intervention 12/2021: LM: Normal LAD: Mid 80% stenosis. Adjacent diag with 40% prox disease Lcx: No significant disease RCA: Large, tortuous. No significant disease     Successful IVUS guided percutaneous coronary intervention mid LAD        PTCA and stent placement 2.75 X 15 mm Onyx Frontier drug-eluting stent        Post dilatation using 3.25X8 mm  balloon at 18 atm  Labs 07/2024: Chol 126, TG 175, HDL 44, LDL 57 HbA1C 7.8% Cr 1.35 TSH 0.8    Labs 10/2023: Hb 13.8 Cr 1.46, K 5.1   Labs 08/26/2022: Glucose 137, BUN/Cr 24/1.37. EGFR 62. Na/K 140/4.8. Rest of the CMP normal H/H 12/39. MCV 91. Platelets 198 HbA1C 7.4% Chol 95, TG 136, HDL 47, LDL 36              Physical Exam:   Physical Exam Vitals and nursing note reviewed.  Constitutional:       General: He is not in acute distress.    Appearance: He is obese.  Neck:     Vascular: No JVD.  Cardiovascular:     Rate and Rhythm: Normal rate and regular rhythm.     Heart sounds: Normal heart sounds. No murmur heard. Pulmonary:     Effort: Pulmonary effort is normal.     Breath sounds: Normal breath sounds. No wheezing or rales.  Musculoskeletal:     Right lower leg: No edema.     Left lower leg: No edema.      VISIT DIAGNOSES:   ICD-10-CM   1. Essential hypertension  I10     2. Coronary artery disease involving native coronary artery of native heart without angina pectoris  I25.10 Lipid panel    Basic metabolic panel with GFR    Lipid panel    Lipid panel    CANCELED: Lipid panel    CANCELED: Basic Metabolic Panel (BMET)    CANCELED: Lipid panel    CANCELED: Lipid panel    3. Mixed hyperlipidemia  E78.2 Lipid panel    Basic metabolic panel with GFR    Lipid panel    Lipid panel    4. OSA (obstructive sleep apnea)  G47.33          ASSESSMENT AND PLAN: .    Gary Jimenez is a 68 y.o. male with hypertension, hyperlipidemia, type 2 diabetes mellitus, CAD, OSA  CAD: Prior PCI. No ischemia on PET/CT (2025). No angina symptoms. Continue Aspirin , statin, Repatha . Check lipid panel.  OSA: F/u w/Dr. Shlomo.   Mixed hyperlipidemia: As above.   Hypertension: Well-controlled  F/u in 1 year  Signed, Newman JINNY Lawrence, MD

## 2024-08-22 ENCOUNTER — Encounter: Payer: Self-pay | Admitting: Cardiology

## 2024-08-22 ENCOUNTER — Other Ambulatory Visit: Payer: Self-pay | Admitting: Pharmacist

## 2024-08-22 ENCOUNTER — Other Ambulatory Visit (HOSPITAL_COMMUNITY): Payer: Self-pay

## 2024-08-22 ENCOUNTER — Ambulatory Visit: Attending: Cardiology | Admitting: Cardiology

## 2024-08-22 VITALS — BP 104/65 | HR 78 | Ht 68.0 in | Wt 265.8 lb

## 2024-08-22 DIAGNOSIS — E782 Mixed hyperlipidemia: Secondary | ICD-10-CM

## 2024-08-22 DIAGNOSIS — I10 Essential (primary) hypertension: Secondary | ICD-10-CM

## 2024-08-22 DIAGNOSIS — I25118 Atherosclerotic heart disease of native coronary artery with other forms of angina pectoris: Secondary | ICD-10-CM

## 2024-08-22 DIAGNOSIS — I2510 Atherosclerotic heart disease of native coronary artery without angina pectoris: Secondary | ICD-10-CM | POA: Diagnosis not present

## 2024-08-22 DIAGNOSIS — G4733 Obstructive sleep apnea (adult) (pediatric): Secondary | ICD-10-CM | POA: Diagnosis not present

## 2024-08-22 MED ORDER — ROSUVASTATIN CALCIUM 20 MG PO TABS
20.0000 mg | ORAL_TABLET | Freq: Every day | ORAL | 3 refills | Status: AC
  Filled 2024-08-22: qty 90, 90d supply, fill #0

## 2024-08-22 MED ORDER — REPATHA SURECLICK 140 MG/ML ~~LOC~~ SOAJ
140.0000 mg | SUBCUTANEOUS | 11 refills | Status: AC
  Filled 2024-08-22: qty 2, 28d supply, fill #0

## 2024-08-22 NOTE — Patient Instructions (Addendum)
 Medication Instructions: STOP Pravastatin   START Crestor 20 mg daily   *If you need a refill on your cardiac medications before your next appointment, please call your pharmacy*  Lab Work: Please have lipid panel in 3 months  BMP - TODAY LIPID PANEL - TODAY  Testing/Procedures: None ordered today.  Follow-Up: At [facility], you and your health needs are our priority.  As part of our continuing mission to provide you with exceptional heart care, our providers are all part of one team.  This team includes your primary Cardiologist (physician) and Advanced Practice Providers or APPs (Physician Assistants and Nurse Practitioners) who all work together to provide you with the care you need, when you need it.  Your next appointment:   1 year(s)  Provider:   Newman JINNY Lawrence, MD    We recommend signing up for the patient portal called MyChart.  Sign up information is provided on this After Visit Summary.  MyChart is used to connect with patients for Virtual Visits (Telemedicine).  Patients are able to view lab/test results, encounter notes, upcoming appointments, etc.  Non-urgent messages can be sent to your provider as well.   To learn more about what you can do with MyChart, go to [email or web address].   Other Instructions

## 2024-08-23 ENCOUNTER — Ambulatory Visit: Payer: Self-pay | Admitting: Cardiology

## 2024-08-23 ENCOUNTER — Other Ambulatory Visit (HOSPITAL_COMMUNITY): Payer: Self-pay

## 2024-08-23 ENCOUNTER — Encounter: Payer: Self-pay | Admitting: Cardiology

## 2024-08-23 DIAGNOSIS — G4733 Obstructive sleep apnea (adult) (pediatric): Secondary | ICD-10-CM | POA: Insufficient documentation

## 2024-08-23 LAB — LIPID PANEL
Chol/HDL Ratio: 3.2 ratio (ref 0.0–5.0)
Cholesterol, Total: 145 mg/dL (ref 100–199)
HDL: 46 mg/dL (ref >39)
LDL Chol Calc (NIH): 65 mg/dL (ref 0–99)
Triglycerides: 205 mg/dL — ABNORMAL HIGH (ref 0–149)
VLDL Cholesterol Cal: 34 mg/dL (ref 5–40)

## 2024-08-23 NOTE — Progress Notes (Signed)
 Triglycerides elevated due to diabetes, but cholesterol otherwise well controlled. Continue current medications.  Thanks MJP

## 2024-11-29 ENCOUNTER — Ambulatory Visit: Admitting: Cardiology
